# Patient Record
Sex: Female | Born: 1995 | Race: White | Hispanic: No | Marital: Single | Smoking: Never smoker
Health system: Southern US, Community
[De-identification: ages and names within clinical notes are randomized; demographics above are authoritative.]

## PROBLEM LIST (undated history)

## (undated) DIAGNOSIS — S43013A Anterior subluxation of unspecified humerus, initial encounter: Secondary | ICD-10-CM

## (undated) DIAGNOSIS — F329 Major depressive disorder, single episode, unspecified: Secondary | ICD-10-CM

## (undated) DIAGNOSIS — F32A Depression, unspecified: Secondary | ICD-10-CM

## (undated) DIAGNOSIS — S43006A Unspecified dislocation of unspecified shoulder joint, initial encounter: Secondary | ICD-10-CM

## (undated) DIAGNOSIS — G43909 Migraine, unspecified, not intractable, without status migrainosus: Secondary | ICD-10-CM

## (undated) HISTORY — DX: Migraine, unspecified, not intractable, without status migrainosus: G43.909

## (undated) HISTORY — PX: CLOSED REDUCTION SHOULDER DISLOCATION: SUR242

## (undated) HISTORY — DX: Depression, unspecified: F32.A

## (undated) HISTORY — DX: Major depressive disorder, single episode, unspecified: F32.9

---
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
## 2013-08-12 ENCOUNTER — Encounter: Payer: Self-pay | Admitting: Family Medicine

## 2013-08-12 ENCOUNTER — Ambulatory Visit (INDEPENDENT_AMBULATORY_CARE_PROVIDER_SITE_OTHER): Payer: 59 | Admitting: Family Medicine

## 2013-08-12 VITALS — BP 122/78 | Ht 68.0 in | Wt 225.0 lb

## 2013-08-12 DIAGNOSIS — M549 Dorsalgia, unspecified: Secondary | ICD-10-CM

## 2013-08-12 MED ORDER — ETODOLAC 400 MG PO TABS: 400.0000 mg | ORAL_TABLET | Freq: Two times a day (BID) | ORAL | Status: DC

## 2013-08-12 MED ORDER — CHLORZOXAZONE 500 MG PO TABS: 500.0000 mg | ORAL_TABLET | Freq: Every evening | ORAL | Status: DC | PRN

## 2013-08-12 NOTE — Progress Notes (Signed)
   Subjective:    Patient ID: Lauren Park, female    DOB: 04/29/1996, 18 y.o.   MRN: [id number]  Back Pain This is a new problem. The current episode started 1 to 4 weeks ago. The problem occurs daily. The problem is unchanged. The quality of the pain is described as burning and shooting (sharp). The pain does not radiate. The pain is worse during the day. The symptoms are aggravated by sitting and lying down. Stiffness is present all day. Pertinent negatives include no abdominal pain, chest pain, fever, leg pain, numbness, paresis or paresthesias. She has tried NSAIDs and heat for the symptoms. The treatment provided mild relief.   Needs refill on celexa.    Review of Systems  Constitutional: Negative for fever, diaphoresis, activity change and fatigue.  HENT: Negative for congestion.   Respiratory: Negative for cough.   Cardiovascular: Negative for chest pain.  Gastrointestinal: Negative for abdominal pain.  Musculoskeletal: Positive for back pain.  Neurological: Negative for numbness and paresthesias.       Objective:   Physical Exam  Constitutional: She is oriented to person, place, and time. She appears well-developed and well-nourished.  HENT:  Head: Normocephalic.  Cardiovascular: Normal rate, regular rhythm and normal heart sounds.   No murmur heard. Pulmonary/Chest: Effort normal and breath sounds normal. No respiratory distress.  Musculoskeletal: She exhibits no edema.  Lymphadenopathy:    She has no cervical adenopathy.  Neurological: She is alert and oriented to person, place, and time.  Psychiatric: She has a normal mood and affect. Her behavior is normal. Judgment and thought content normal.          Assessment & Plan:  1 pain-this is scapular pain on the left side. Probably rhomboid pain. Anti-inflammatory a regular basis muscle relaxer at nighttime massage and stretching if not better over the next 2-3 weeks notify us may need further testing or possibly  physical therapy  #2-stress related issues under good control has been on Celexa for years requests refills and we will send in refills patient followup 6 months. If ever suicidal needs to be seen right away. Patient understands this. She denies being depressed or suicidal currently. She states when she tried to stop the medicine before she noticed her relapse.

## 2013-08-22 ENCOUNTER — Telehealth: Payer: Self-pay | Admitting: Family Medicine

## 2013-08-22 NOTE — Telephone Encounter (Signed)
Number left on message was incorrect. [facility] on pt's home and voicemail. Need to find out dose of celexa she is taking because it is not on med list. [facility] to refill but they stated they have never filled celexa. Last office  Note states it was sent in but no dosage.

## 2013-08-22 NOTE — Telephone Encounter (Signed)
[facility] Patient told the wrong pharmacy the first time.

## 2013-08-29 MED ORDER — CITALOPRAM HYDROBROMIDE 40 MG PO TABS: 40.0000 mg | ORAL_TABLET | Freq: Every day | ORAL | Status: DC

## 2013-08-29 NOTE — Telephone Encounter (Signed)
Patient states she is on Celexa 40mg  daily. Rx sent electronically to pharmacy. Patient notified.

## 2013-11-13 ENCOUNTER — Telehealth: Payer: Self-pay | Admitting: Family Medicine

## 2013-11-13 MED ORDER — PREDNISONE 20 MG PO TABS: ORAL_TABLET | ORAL | Status: DC

## 2013-11-13 NOTE — Telephone Encounter (Signed)
Medication sent to pharmacy. Patient's mom was notified.

## 2013-11-13 NOTE — Telephone Encounter (Signed)
Patient has poison oak on her legs, arm, face, chin neck. Would like prednisone called in if possible.    [facility]

## 2013-11-13 NOTE — Telephone Encounter (Signed)
Verify not pregnant.may call in prednisone 20 mg, 3qd for 3d then 2qd for 3d then 1qd for 3d

## 2013-11-28 ENCOUNTER — Encounter: Payer: Self-pay | Admitting: Family Medicine

## 2013-11-28 ENCOUNTER — Ambulatory Visit (INDEPENDENT_AMBULATORY_CARE_PROVIDER_SITE_OTHER): Payer: 59 | Admitting: Family Medicine

## 2013-11-28 VITALS — BP 118/76 | Temp 98.4°F | Ht 68.0 in | Wt 234.0 lb

## 2013-11-28 DIAGNOSIS — L259 Unspecified contact dermatitis, unspecified cause: Secondary | ICD-10-CM

## 2013-11-28 MED ORDER — METHYLPREDNISOLONE ACETATE 40 MG/ML IJ SUSP
40.0000 mg | Freq: Once | INTRAMUSCULAR | Status: AC
  Administered 2013-11-28: 40 mg via INTRAMUSCULAR

## 2013-11-28 MED ORDER — PREDNISONE 20 MG PO TABS: ORAL_TABLET | ORAL | Status: DC

## 2013-11-28 NOTE — Progress Notes (Signed)
   Subjective:    Patient ID: Lauren Park, female    DOB: 1996/03/01, 18 y.o.   MRN: [id number]  Rash The current episode started 1 to 4 weeks ago. Pain location: all over body. The rash is characterized by itchiness and redness. Associated with: poison ivy. Treatments tried: calamine lotion, benadryl.  2 weeks ago began No sign of any type secondary infection no fevers. Was treated with prednisone but then it returned on her.  Works outside some. Review of Systems  Skin: Positive for rash.   Complains of itching.    Objective:   Physical Exam Contact dermatitis on both legs also in the arms not weeping. It multiple inflammatory areas.       Assessment & Plan:  Contact dermatitis-prednisone taper for 9 days, Depo-Medrol shot. I believe the patient had a flareup as the previous prednisone tapered off reach repeat. Followup if ongoing troubles.

## 2014-02-06 ENCOUNTER — Encounter: Payer: Self-pay | Admitting: Nurse Practitioner

## 2014-02-06 ENCOUNTER — Ambulatory Visit (INDEPENDENT_AMBULATORY_CARE_PROVIDER_SITE_OTHER): Payer: 59 | Admitting: Nurse Practitioner

## 2014-02-06 VITALS — BP 112/80 | Ht 68.0 in | Wt 239.0 lb

## 2014-02-06 DIAGNOSIS — L708 Other acne: Secondary | ICD-10-CM

## 2014-02-06 DIAGNOSIS — N925 Other specified irregular menstruation: Secondary | ICD-10-CM

## 2014-02-06 DIAGNOSIS — N938 Other specified abnormal uterine and vaginal bleeding: Secondary | ICD-10-CM

## 2014-02-06 DIAGNOSIS — N949 Unspecified condition associated with female genital organs and menstrual cycle: Secondary | ICD-10-CM

## 2014-02-06 DIAGNOSIS — L700 Acne vulgaris: Secondary | ICD-10-CM

## 2014-02-06 MED ORDER — ADAPALENE 0.1 % EX CREA: TOPICAL_CREAM | CUTANEOUS | Status: DC

## 2014-02-06 MED ORDER — NORGESTIMATE-ETH ESTRADIOL 0.25-35 MG-MCG PO TABS: 1.0000 | ORAL_TABLET | Freq: Every day | ORAL | Status: DC

## 2014-02-06 NOTE — Patient Instructions (Signed)
cetaphil cleanser

## 2014-02-10 ENCOUNTER — Encounter: Payer: Self-pay | Admitting: Nurse Practitioner

## 2014-02-10 DIAGNOSIS — L700 Acne vulgaris: Secondary | ICD-10-CM | POA: Insufficient documentation

## 2014-02-10 DIAGNOSIS — N938 Other specified abnormal uterine and vaginal bleeding: Secondary | ICD-10-CM | POA: Insufficient documentation

## 2014-02-10 NOTE — Progress Notes (Signed)
Subjective:  Presents to discuss her acne. Having irregular cycles over the past 2 years. Lasting 7-8 days usually. Slightly heavy flow, changing her super maxi pad to 3 times per day. Has had one partner in the past, no current partner. Has not tried any prescription medications for her acne.  Objective:   BP 112/80  Ht 5\' 8"  (1.727 m)  Wt 239 lb (108.41 kg)  BMI 36.35 kg/m2  LMP 02/01/2014 NAD. Alert, oriented. Lungs clear. Heart regular rate rhythm. Papular/pustular acne noted on the face upper back and slightly on the upper mid chest area.  Assessment: DUB (dysfunctional uterine bleeding)  Acne vulgaris  Plan:  Meds ordered this encounter  Medications  . norgestimate-ethinyl estradiol (ORTHO-CYCLEN,SPRINTEC,PREVIFEM) 0.25-35 MG-MCG tablet    Sig: Take 1 tablet by mouth daily.    Dispense:  3 Package    Refill:  0    Order Specific Question:  Supervising Provider    Answer:  Merlyn AlbertLUKING, WILLIAM S [[id number]]  . adapalene (DIFFERIN) 0.1 % cream    Sig: Apply to affected area nightly    Dispense:  45 g    Refill:  1    Order Specific Question:  Supervising Provider    Answer:  Merlyn AlbertLUKING, WILLIAM S [[id number]]   Return in about 2 months (around 04/08/2014). Call back sooner if any problems. Start birth control pills first Sunday after her next cycle begins. Discussed potential adverse effects associated with OC use.

## 2014-02-27 ENCOUNTER — Ambulatory Visit: Payer: 59 | Admitting: Nurse Practitioner

## 2014-03-23 IMAGING — CR DG SHOULDER 2+V*L*
2 series · 2 of 2 positions shown · non-contrast
Comparison: 01/03/2012

CLINICAL DATA: The patient.  Pain.

LEFT SHOULDER - 2+ VIEW

[view not recorded (1 of 2)]
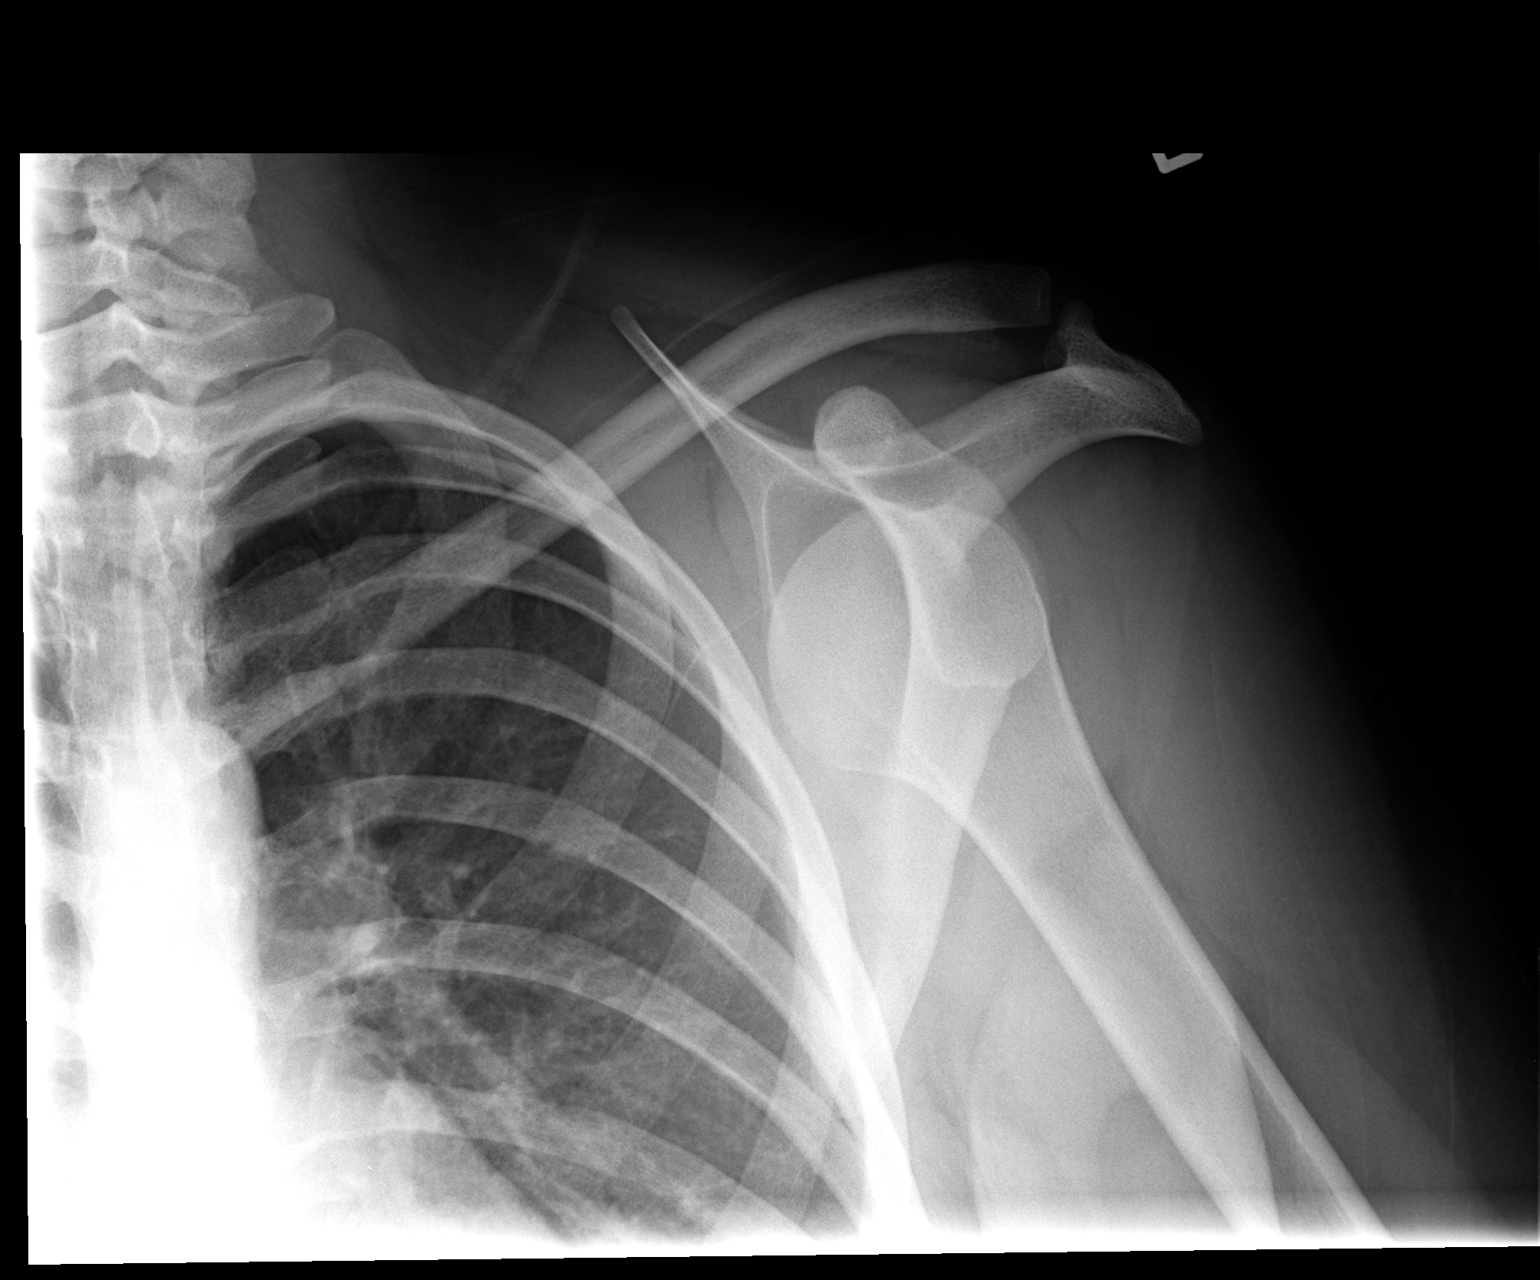

[view not recorded (2 of 2)]
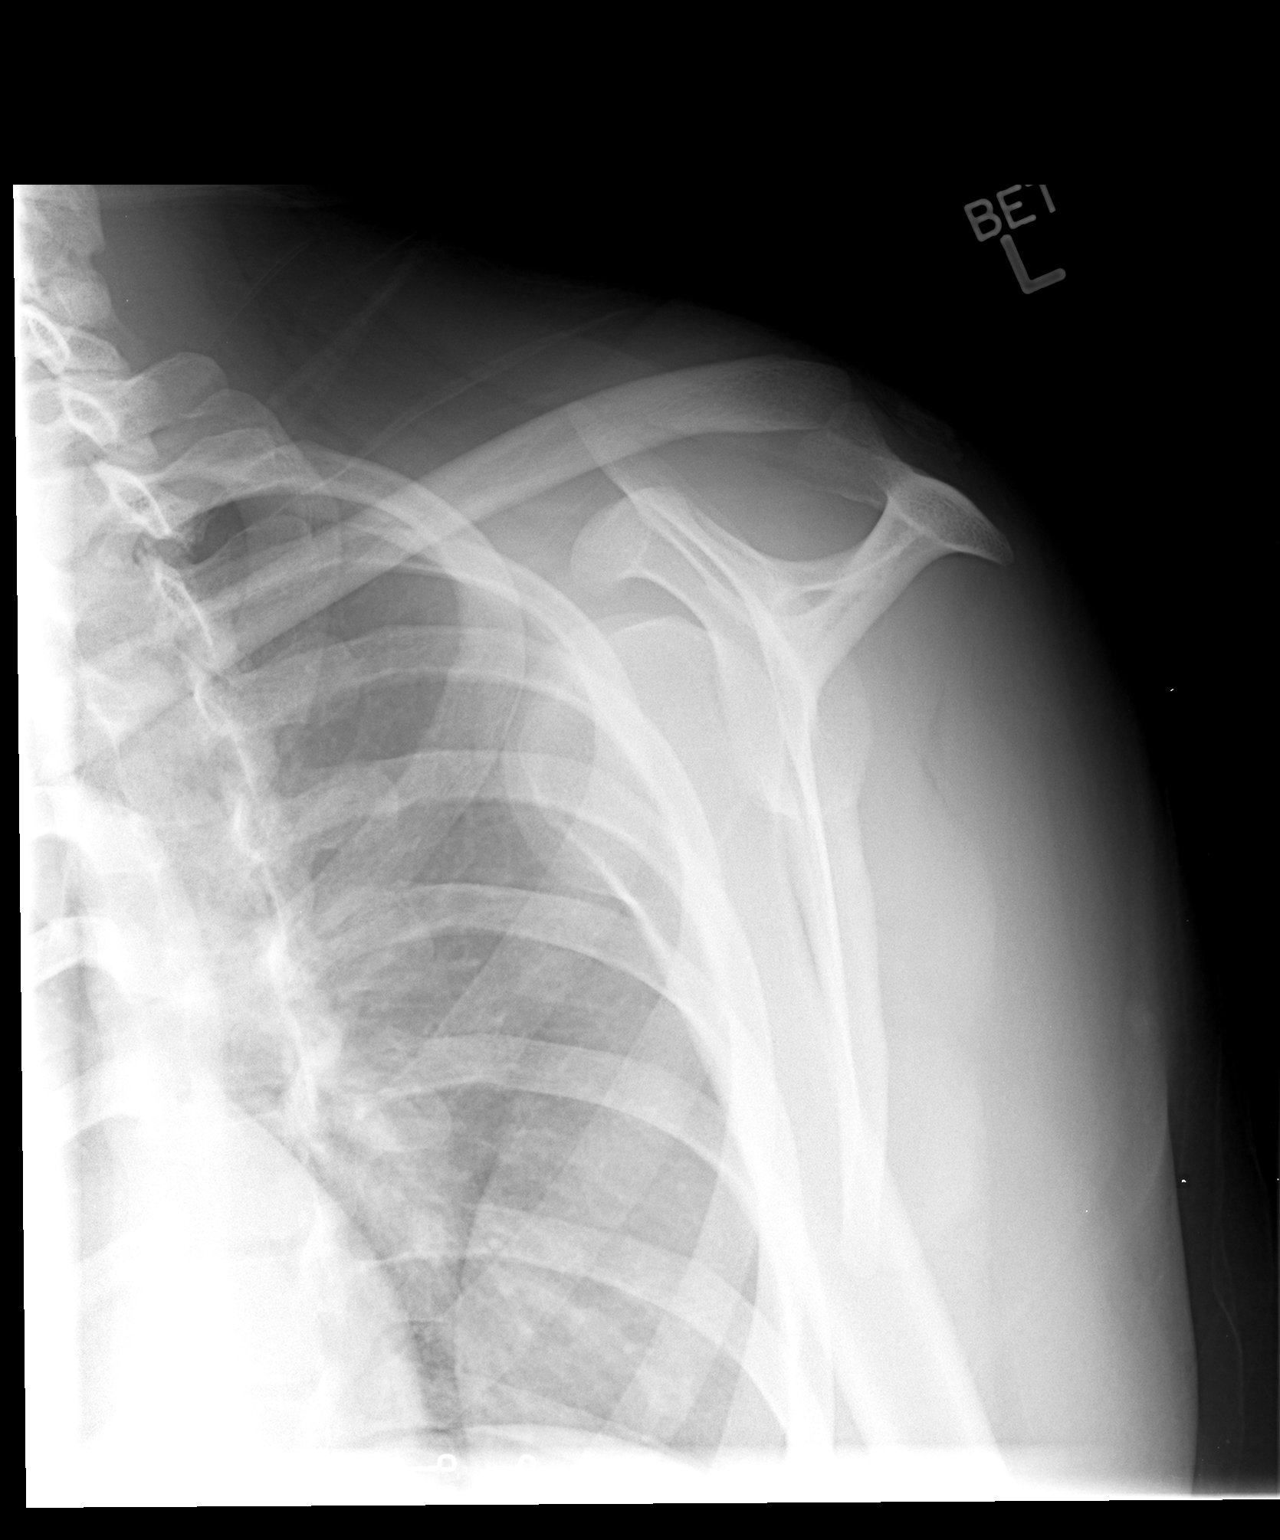

[2 of 2 positions shown; findings below may reference images not displayed]

FINDINGS: There is anterior dislocation of the humeral head
relative to the glenoid.  No discernible fracture.
IMPRESSION: Anterior dislocation of the humeral head relative to the glenoid.

## 2014-03-23 IMAGING — CR DG SHOULDER 2+V*L*
2 series · 2 of 2 positions shown · non-contrast
Comparison: Same day

CLINICAL DATA: Shoulder dislocation and post reduction

LEFT SHOULDER - 2+ VIEW

[view not recorded (1 of 2)]
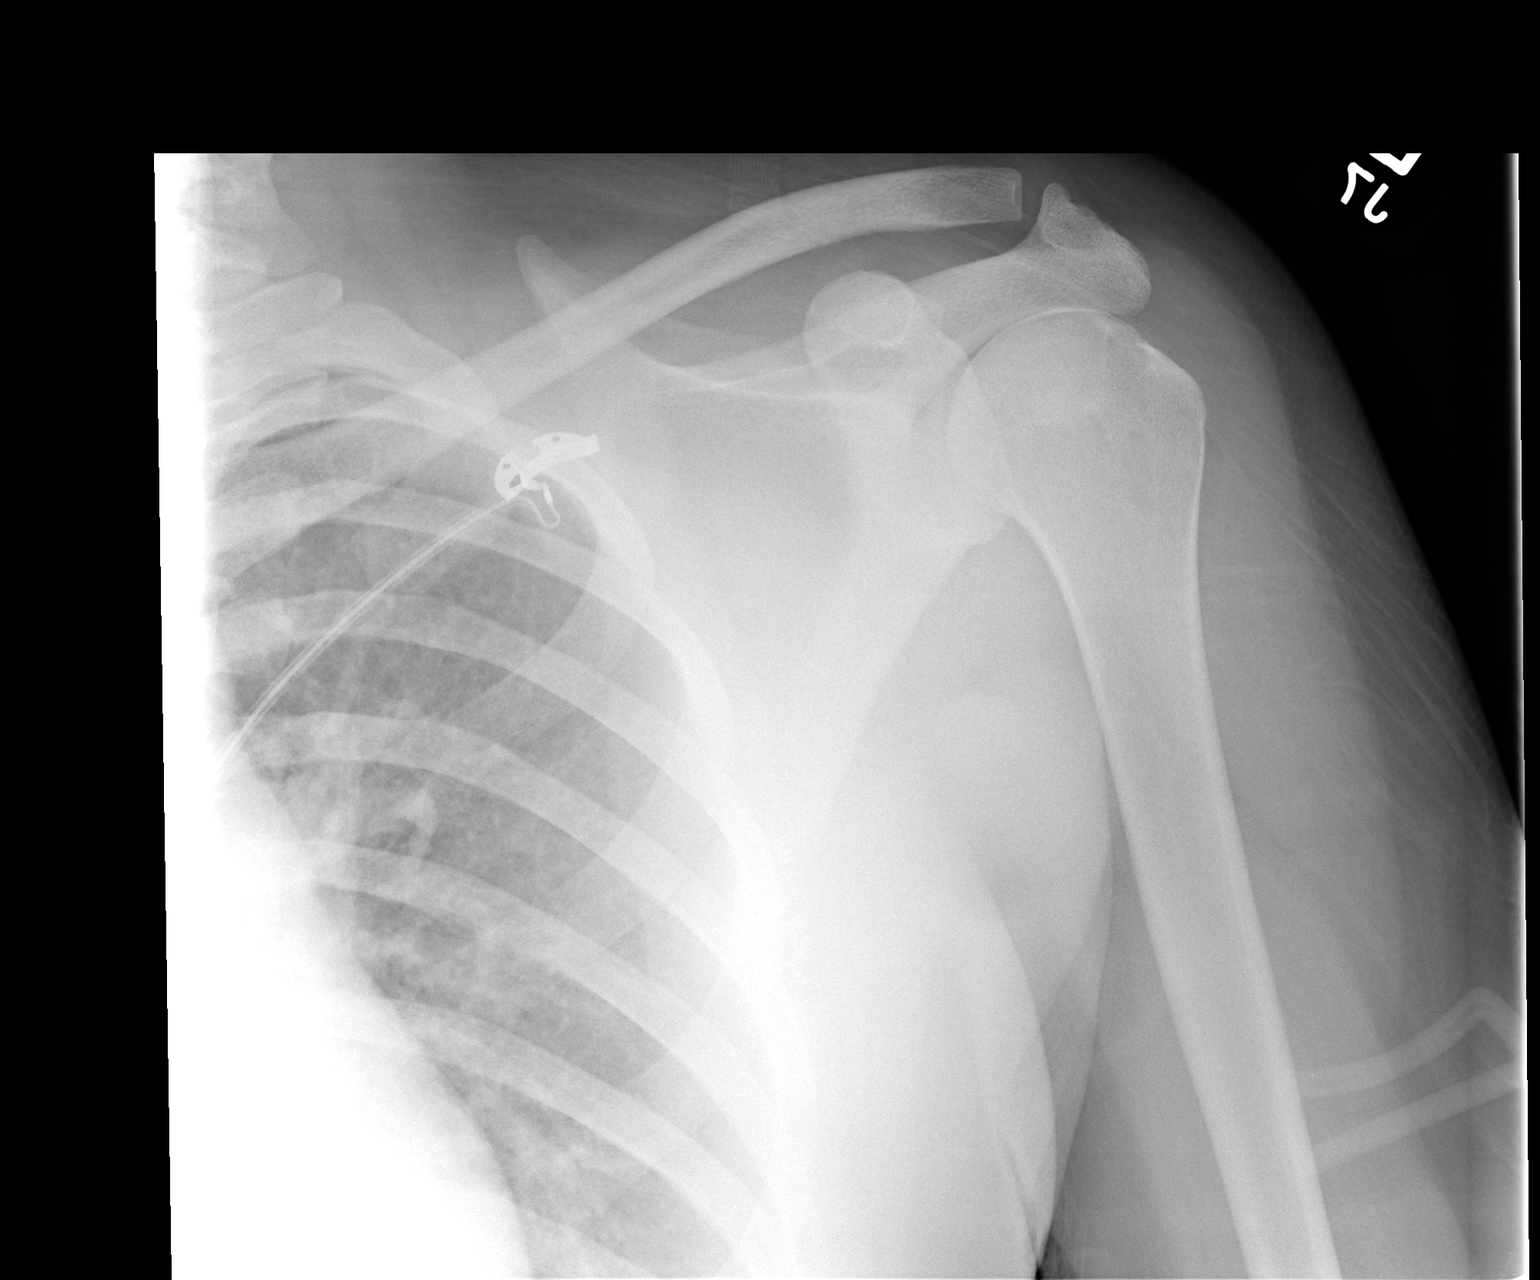

[view not recorded (2 of 2)]
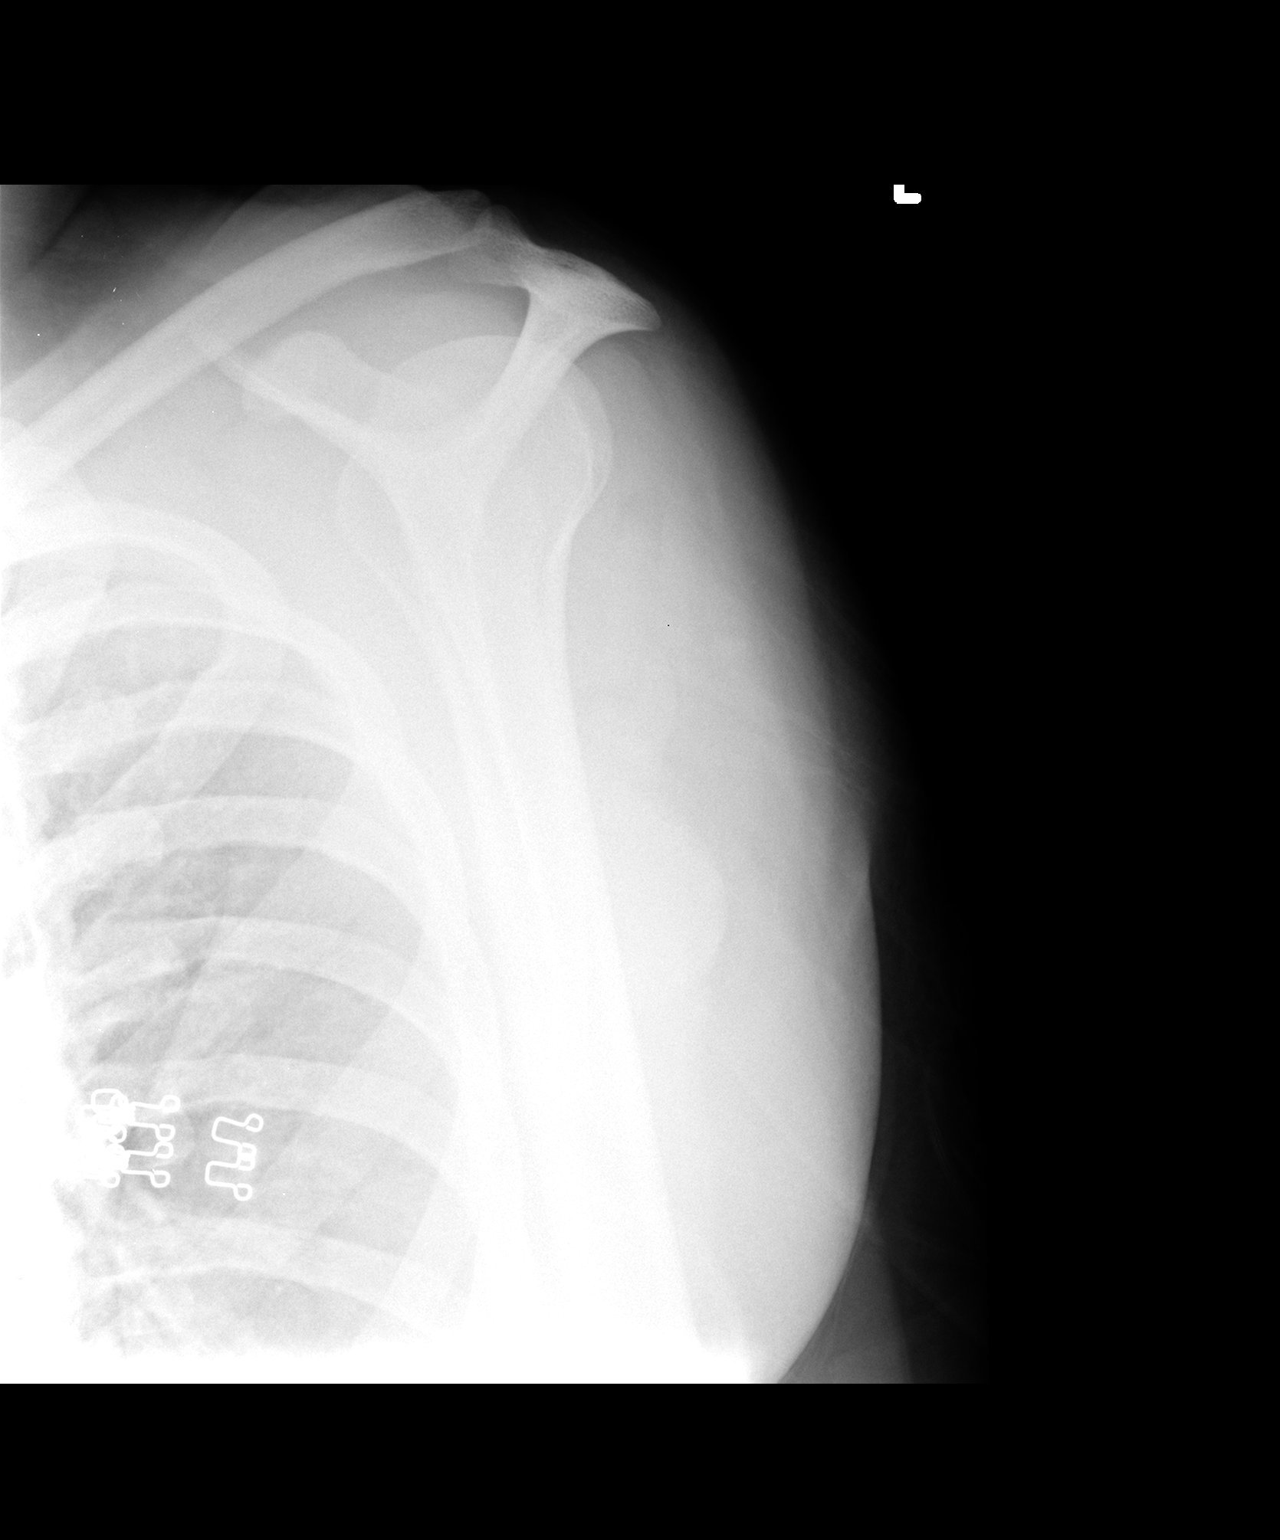

[2 of 2 positions shown; findings below may reference images not displayed]

FINDINGS: The humeral head is relocated in the bony glenoid.  No
definite Hill-Sachs or Bankart fracture.
IMPRESSION: Relocated.  No visible fracture.

## 2014-04-06 ENCOUNTER — Ambulatory Visit: Payer: 59 | Admitting: Nurse Practitioner

## 2014-07-22 ENCOUNTER — Encounter: Payer: Self-pay | Admitting: Nurse Practitioner

## 2014-07-22 ENCOUNTER — Ambulatory Visit (INDEPENDENT_AMBULATORY_CARE_PROVIDER_SITE_OTHER): Payer: 59 | Admitting: Nurse Practitioner

## 2014-07-22 ENCOUNTER — Encounter: Payer: Self-pay | Admitting: Family Medicine

## 2014-07-22 VITALS — BP 102/70 | Temp 98.5°F | Ht 68.0 in | Wt 246.4 lb

## 2014-07-22 DIAGNOSIS — B349 Viral infection, unspecified: Secondary | ICD-10-CM

## 2014-07-25 ENCOUNTER — Encounter: Payer: Self-pay | Admitting: Nurse Practitioner

## 2014-07-25 NOTE — Progress Notes (Signed)
Subjective:  Presents for c/o fever, chills, headache, sore throat, body aches and malaise that began yesterday. No cough. No wheezing. On her menses. No ear pain. No vomiting or diarrhea. Taking fluids well. Voiding nl.   Objective:   BP 102/70 mmHg  Temp(Src) 98.5 F (36.9 C) (Oral)  Ht 5\' 8"  (1.727 m)  Wt 246 lb 6 oz (111.755 kg)  BMI 37.47 kg/m2 NAD. Alert, oriented. TMs clear effusion. Pharynx clear and moist. Neck supple with mild anterior adenopathy. Lungs clear. Heart RRR.   Assessment: Viral illness  Plan: reviewed symptomatic care and warning signs. Call back in 48 hours if no improvement sooner if worse.

## 2015-04-05 ENCOUNTER — Encounter: Payer: Self-pay | Admitting: Nurse Practitioner

## 2015-04-05 ENCOUNTER — Ambulatory Visit (INDEPENDENT_AMBULATORY_CARE_PROVIDER_SITE_OTHER): Payer: 59 | Admitting: Nurse Practitioner

## 2015-04-05 VITALS — BP 110/70 | Ht 68.0 in | Wt 216.2 lb

## 2015-04-05 DIAGNOSIS — N921 Excessive and frequent menstruation with irregular cycle: Secondary | ICD-10-CM

## 2015-04-05 DIAGNOSIS — Z0000 Encounter for general adult medical examination without abnormal findings: Secondary | ICD-10-CM | POA: Diagnosis not present

## 2015-04-07 ENCOUNTER — Encounter: Payer: Self-pay | Admitting: Nurse Practitioner

## 2015-04-07 DIAGNOSIS — N921 Excessive and frequent menstruation with irregular cycle: Secondary | ICD-10-CM | POA: Insufficient documentation

## 2015-04-07 NOTE — Progress Notes (Signed)
   Subjective:    Patient ID: Lauren Park, female    DOB: [birth year], 19 y.o.   MRN: [id number]  HPI presents with her adopted mother for her wellness physical. Currently in college; doing well. Irregular menses; lasting 7 days; heavy 5-6 days. Healthy diet. Regular activity. Regular dental care. Denies history of sexual activity. Has done very well with weight loss efforts.    Review of Systems  Constitutional: Negative for fever, activity change, appetite change and fatigue.  HENT: Negative for dental problem, ear pain, sinus pressure and sore throat.   Eyes: Negative for visual disturbance.  Respiratory: Negative for cough, chest tightness, shortness of breath and wheezing.   Cardiovascular: Negative for chest pain.  Gastrointestinal: Negative for nausea, vomiting, abdominal pain, diarrhea, constipation and abdominal distention.  Genitourinary: Positive for menstrual problem. Negative for dysuria, urgency, frequency, vaginal discharge, enuresis, difficulty urinating, genital sores and pelvic pain.  Psychiatric/Behavioral: Negative for behavioral problems, sleep disturbance and dysphoric mood. The patient is not nervous/anxious.        Objective:   Physical Exam  Constitutional: She is oriented to person, place, and time. She appears well-developed. No distress.  HENT:  Right Ear: External ear normal.  Left Ear: External ear normal.  Mouth/Throat: Oropharynx is clear and moist.  Neck: Normal range of motion. Neck supple. No tracheal deviation present. No thyromegaly present.  Cardiovascular: Normal rate, regular rhythm and normal heart sounds.  Exam reveals no gallop.   No murmur heard. Pulmonary/Chest: Effort normal and breath sounds normal.  Abdominal: Soft. She exhibits no distension. There is no tenderness.  Genitourinary:  Defers GU and breast exams. Denies any problems. Tanner stage V.  Musculoskeletal: She exhibits no edema.  Lymphadenopathy:    She has no cervical  adenopathy.  Neurological: She is alert and oriented to person, place, and time. She has normal reflexes.  Skin: Skin is warm and dry. No rash noted.  Psychiatric: She has a normal mood and affect. Her behavior is normal. Thought content normal.  Vitals reviewed.         Assessment & Plan:   Problem List Items Addressed This Visit      Other   Menorrhagia with irregular cycle   Morbid obesity    Other Visit Diagnoses    Routine general medical examination at a health care facility    -  Primary        Plan:  Discussed options regarding cycles. Defers hormones at this time. Continue weight loss efforts. Discussed safe sex issues. Our office does not have immunization record from early childhood; also this is not in the state system due to year it was done. To get records from previous [place] or school system. Given record of vaccines given at our office.  Return in about 1 year (around 04/04/2016) for physical. PAP smear at next PE.

## 2015-12-21 ENCOUNTER — Ambulatory Visit (INDEPENDENT_AMBULATORY_CARE_PROVIDER_SITE_OTHER): Payer: 59 | Admitting: Nurse Practitioner

## 2015-12-21 ENCOUNTER — Encounter: Payer: Self-pay | Admitting: Nurse Practitioner

## 2015-12-21 VITALS — BP 110/80 | Temp 98.8°F | Ht 68.0 in | Wt 253.4 lb

## 2015-12-21 DIAGNOSIS — L509 Urticaria, unspecified: Secondary | ICD-10-CM

## 2015-12-21 MED ORDER — TRIAMCINOLONE ACETONIDE 0.1 % EX CREA: 1.0000 "application " | TOPICAL_CREAM | Freq: Two times a day (BID) | CUTANEOUS | Status: DC

## 2015-12-21 MED FILL — TRIAMCINOLONE 0.1% CREAM: 0.1 | 14 days supply | Qty: 30 | Fill #0

## 2015-12-21 NOTE — Patient Instructions (Signed)
Loratadine 10 mg every morning Benadryl 25 mg every night Pepcid every day

## 2015-12-23 ENCOUNTER — Encounter: Payer: Self-pay | Admitting: Nurse Practitioner

## 2015-12-23 NOTE — Progress Notes (Signed)
Subjective:  Presents for complaints of off-and-on rash that began back in the fall. Was better during the winter. Has started flaring up again. Has not identified any specific triggers. No known allergens. Different areas of the body. Resolves on its own. Very pruritic. Patient has a picture of an early lesion which is clearly a whelp/hive.  Objective:   BP 110/80 mmHg  Temp(Src) 98.8 F (37.1 C) (Oral)  Ht 5\' 8"  (1.727 m)  Wt 253 lb 6 oz (114.93 kg)  BMI 38.53 kg/m2 NAD. Alert, oriented. Lungs clear. Heart regular rate rhythm. Fading minimally raised pink area on the left arm with a few almost resolved pink nonraised areas on the legs.  Assessment: Hives  Plan:  Meds ordered this encounter  Medications  . triamcinolone cream (KENALOG) 0.1 %    Sig: Apply 1 application topically 2 (two) times daily. Prn rash; use up to 2 weeks    Dispense:  30 g    Refill:  0    Order Specific Question:  Supervising Provider    Answer:  Merlyn AlbertLUKING, WILLIAM S [[id number]]   Loratadine 10 mg every morning Benadryl 25 mg every night Pepcid every day  Take this regimen daily. Continue to watch for any triggers for the itching. Call back in 2-3 weeks if persist, plan referral to allergy specialist at that time. Triamcinolone cream to help with itching. Patient understands that rash is more systemic versus a contact rash.Based on the history, feel that excessive heat may be one trigger.

## 2016-01-27 DIAGNOSIS — L282 Other prurigo: Secondary | ICD-10-CM | POA: Diagnosis not present

## 2016-01-31 MED FILL — CLOBETASOL 0.05% CREAM: 0.05 | 20 days supply | Qty: 60 | Fill #0

## 2016-03-30 ENCOUNTER — Ambulatory Visit (INDEPENDENT_AMBULATORY_CARE_PROVIDER_SITE_OTHER): Payer: 59 | Admitting: Nurse Practitioner

## 2016-03-30 ENCOUNTER — Encounter: Payer: Self-pay | Admitting: Nurse Practitioner

## 2016-03-30 VITALS — BP 122/74 | Ht 68.0 in | Wt 245.0 lb

## 2016-03-30 DIAGNOSIS — R5383 Other fatigue: Secondary | ICD-10-CM

## 2016-03-30 DIAGNOSIS — N938 Other specified abnormal uterine and vaginal bleeding: Secondary | ICD-10-CM | POA: Diagnosis not present

## 2016-03-30 MED ORDER — NORGESTIMATE-ETH ESTRADIOL 0.25-35 MG-MCG PO TABS
1.0000 | ORAL_TABLET | Freq: Every day | ORAL | 3 refills | Status: DC
Start: 2016-03-30 — End: 2017-03-13

## 2016-03-30 MED FILL — MONO-LINYAH 28 TABLET: 0.25-35 | 84 days supply | Qty: 84 | Fill #0

## 2016-03-31 ENCOUNTER — Encounter: Payer: Self-pay | Admitting: Nurse Practitioner

## 2016-03-31 LAB — BASIC METABOLIC PANEL
BUN/Creatinine Ratio: 11 (ref 9–23)
BUN: 8 mg/dL (ref 6–20)
CALCIUM: 9.7 mg/dL (ref 8.7–10.2)
CO2: 23 mmol/L (ref 18–29)
CREATININE: 0.7 mg/dL (ref 0.57–1.00)
Chloride: 103 mmol/L (ref 96–106)
GFR calc Af Amer: 143 mL/min/{1.73_m2} (ref >59)
GFR calc non Af Amer: 124 mL/min/{1.73_m2} (ref >59)
GLUCOSE: 90 mg/dL (ref 65–99)
Potassium: 4.4 mmol/L (ref 3.5–5.2)
Sodium: 142 mmol/L (ref 134–144)

## 2016-03-31 LAB — HEPATIC FUNCTION PANEL
ALBUMIN: 4.5 g/dL (ref 3.5–5.5)
ALK PHOS: 107 IU/L (ref 39–117)
ALT: 11 IU/L (ref 0–32)
AST: 17 IU/L (ref 0–40)
BILIRUBIN, DIRECT: 0.09 mg/dL (ref 0.00–0.40)
Bilirubin Total: 0.4 mg/dL (ref 0.0–1.2)
TOTAL PROTEIN: 7.7 g/dL (ref 6.0–8.5)

## 2016-03-31 LAB — CBC WITH DIFFERENTIAL/PLATELET
Basophils Absolute: 0 10*3/uL (ref 0.0–0.2)
Basos: 0 %
EOS (ABSOLUTE): 0.1 10*3/uL (ref 0.0–0.4)
EOS: 1 %
HEMATOCRIT: 38.8 % (ref 34.0–46.6)
HEMOGLOBIN: 12.8 g/dL (ref 11.1–15.9)
IMMATURE GRANULOCYTES: 0 %
Immature Grans (Abs): 0 10*3/uL (ref 0.0–0.1)
LYMPHS ABS: 3.6 10*3/uL — AB (ref 0.7–3.1)
Lymphs: 41 %
MCH: 24.7 pg — ABNORMAL LOW (ref 26.6–33.0)
MCHC: 33 g/dL (ref 31.5–35.7)
MCV: 75 fL — ABNORMAL LOW (ref 79–97)
MONOCYTES: 7 %
Monocytes Absolute: 0.6 10*3/uL (ref 0.1–0.9)
Neutrophils Absolute: 4.5 10*3/uL (ref 1.4–7.0)
Neutrophils: 51 %
Platelets: 354 10*3/uL (ref 150–379)
RBC: 5.19 x10E6/uL (ref 3.77–5.28)
RDW: 15.8 % — ABNORMAL HIGH (ref 12.3–15.4)
WBC: 8.9 10*3/uL (ref 3.4–10.8)

## 2016-03-31 LAB — LIPID PANEL
CHOL/HDL RATIO: 4.1 ratio (ref 0.0–4.4)
Cholesterol, Total: 160 mg/dL (ref 100–199)
HDL: 39 mg/dL — ABNORMAL LOW (ref >39)
LDL CALC: 93 mg/dL (ref 0–99)
Triglycerides: 138 mg/dL (ref 0–149)
VLDL Cholesterol Cal: 28 mg/dL (ref 5–40)

## 2016-03-31 LAB — TSH: TSH: 2.93 u[IU]/mL (ref 0.450–4.500)

## 2016-03-31 LAB — VITAMIN D 25 HYDROXY (VIT D DEFICIENCY, FRACTURES): Vit D, 25-Hydroxy: 24.5 ng/mL — ABNORMAL LOW (ref 30.0–100.0)

## 2016-03-31 NOTE — Progress Notes (Signed)
Subjective:  Presents for c/o fatigue, headaches, sleep disturbance and hot flashes. Not currently on birth control. Not sexually active. Irregular cycles; can have 2 in one month or skip 2-3 months. Consistently 7-8 days but varying flow. Has had problems off and on with this for years. Has been treated with Depo-Medrol and Ortho-Cyclen in the past. Has a remote history of migraine, has had 2 over the past 2 weeks. Mainly relates them to significant weather change. No change in migraine symptomatology. Does not seem to have a connection to her cycles. Has had significant weight gain over the past year. At one time was doing well with this and working out but has been struggling. Some acne. No excessive hair growth.  Objective:   BP 122/74   Ht 5\' 8"  (1.727 m)   Wt 245 lb (111.1 kg)   BMI 37.25 kg/m  NAD. Alert, oriented. Lungs clear. Heart regular rate rhythm. Thyroid nontender to palpation, no mass or goiter noted. Has had some recent weight loss over the past few months, still has gained 30 pounds since last year. Mild facial acne.  Assessment:  Problem List Items Addressed This Visit      Other   DUB (dysfunctional uterine bleeding) - Primary   Relevant Orders   CBC with Differential/Platelet (Completed)   TSH (Completed)   Basic metabolic panel (Completed)   Hepatic function panel (Completed)   Lipid panel (Completed)   VITAMIN D 25 Hydroxy (Vit-D Deficiency, Fractures) (Completed)   Morbid obesity (HCC)   Relevant Orders   CBC with Differential/Platelet (Completed)   TSH (Completed)   Basic metabolic panel (Completed)   Hepatic function panel (Completed)   Lipid panel (Completed)   VITAMIN D 25 Hydroxy (Vit-D Deficiency, Fractures) (Completed)    Other Visit Diagnoses    Other fatigue       Relevant Orders   CBC with Differential/Platelet (Completed)   TSH (Completed)   Basic metabolic panel (Completed)   Hepatic function panel (Completed)   Lipid panel (Completed)   VITAMIN D 25 Hydroxy (Vit-D Deficiency, Fractures) (Completed)     Plan:  Meds ordered this encounter  Medications  . clobetasol cream (TEMOVATE) 0.05 %    Sig: Apply 1 application topically 2 (two) times daily.  . norgestimate-ethinyl estradiol (ORTHO-CYCLEN,SPRINTEC,PREVIFEM) 0.25-35 MG-MCG tablet    Sig: Take 1 tablet by mouth daily.    Dispense:  3 Package    Refill:  3    Order Specific Question:   Supervising Provider    Answer:   Merlyn AlbertLUKING, WILLIAM S [[id number]]   Restart Ortho-Cyclen as directed. Lab work pending. Encouraged healthy diet low in sugar and simple carbs and regular activity. Patient wishes to hold on medication for migraines at this point, will wait to see if these continue. Patient to call back at that time to discuss options. Warning signs reviewed. Reminded about preventive health physical. Return if symptoms worsen or fail to improve.

## 2016-06-05 MED FILL — MONO-LINYAH 28 TABLET: 0.25-35 | 84 days supply | Qty: 84 | Fill #1

## 2016-09-21 MED FILL — MONO-LINYAH 28 TABLET: 0.25-35 | 84 days supply | Qty: 84 | Fill #2

## 2016-11-20 ENCOUNTER — Ambulatory Visit (INDEPENDENT_AMBULATORY_CARE_PROVIDER_SITE_OTHER): Payer: 59 | Admitting: Nurse Practitioner

## 2016-11-20 ENCOUNTER — Encounter: Payer: Self-pay | Admitting: Nurse Practitioner

## 2016-11-20 VITALS — BP 118/76 | Ht 67.0 in | Wt 253.5 lb

## 2016-11-20 DIAGNOSIS — Z0000 Encounter for general adult medical examination without abnormal findings: Secondary | ICD-10-CM

## 2016-11-20 DIAGNOSIS — G479 Sleep disorder, unspecified: Secondary | ICD-10-CM | POA: Insufficient documentation

## 2016-11-20 MED ORDER — FLUCONAZOLE 150 MG PO TABS: ORAL_TABLET | ORAL | 1 refills | Status: DC

## 2016-11-20 MED FILL — FLUCONAZOLE 150 MG TABLET: 150 | 6 days supply | Qty: 6 | Fill #0

## 2016-11-20 NOTE — Progress Notes (Signed)
Subjective:    Patient ID: Lauren Park, female    DOB: Jun 19, 1996, 21 y.o.   MRN: [id number]  HPI presents for her wellness exam. Regular cycles, slightly heavy at times. No missed birth control pills. Currently on her cycle, defers pelvic exam today. Has had 1 sexual partner. Not currently sexually active. Complaints of vaginal itching with clumpy discharge after each cycle which resolved after several days. Overall healthy diet. Swims a mile about 3 days a week for the past 6 weeks. Has not had regular dental care. Has a history of difficulty sleeping. No trouble going to sleep waking up every few hours. Denies any overt symptoms of sleep apnea. Depression screen [facility] 2/9 11/20/2016 11/20/2016  Decreased Interest 1 1  Down, Depressed, Hopeless 2 2  PHQ - 2 Score 3 3  Altered sleeping 2 -  Tired, decreased energy 2 -  Change in appetite 2 -  Feeling bad or failure about yourself  1 -  Trouble concentrating 0 -  Moving slowly or fidgety/restless 0 -  Suicidal thoughts 0 -  PHQ-9 Score 10 -       Review of Systems  Constitutional: Negative for activity change, appetite change and fatigue.  HENT: Negative for dental problem, ear pain, sinus pressure and sore throat.   Respiratory: Negative for cough, chest tightness, shortness of breath and wheezing.   Cardiovascular: Negative for chest pain.  Gastrointestinal: Negative for abdominal distention, abdominal pain, constipation, diarrhea, nausea and vomiting.  Genitourinary: Positive for vaginal discharge. Negative for difficulty urinating, dysuria, enuresis, frequency, genital sores, menstrual problem, pelvic pain and urgency.  Skin:       Acne much improved on birth control pill.  Psychiatric/Behavioral: Positive for dysphoric mood and sleep disturbance. Negative for suicidal ideas. The patient is not nervous/anxious.        Mild depression symptoms. See PHQ9       Objective:   Physical Exam  Constitutional: She is oriented to  person, place, and time. She appears well-developed. No distress.  HENT:  Right Ear: External ear normal.  Left Ear: External ear normal.  Mouth/Throat: Oropharynx is clear and moist.  Neck: Normal range of motion. Neck supple. No tracheal deviation present. No thyromegaly present.  Cardiovascular: Normal rate, regular rhythm and normal heart sounds.  Exam reveals no gallop.   No murmur heard. Pulmonary/Chest: Effort normal and breath sounds normal.  Abdominal: Soft. She exhibits no distension. There is no tenderness.  Genitourinary:  Genitourinary Comments: Defers pelvic exam due to menses.   Musculoskeletal: Normal range of motion.  Lymphadenopathy:    She has no cervical adenopathy.  Neurological: She is alert and oriented to person, place, and time.  Skin: Skin is warm and dry. No rash noted.  Psychiatric: She has a normal mood and affect. Her behavior is normal.  Vitals reviewed. Breast exam: areas of dense tissue; no masses; axillae no adenopathy.        Assessment & Plan:   Problem List Items Addressed This Visit      Other   Sleep disturbance    Other Visit Diagnoses    Routine general medical examination at a health care facility    -  Primary     Meds ordered this encounter  Medications  . fluconazole (DIFLUCAN) 150 MG tablet    Sig: One po qd prn yeast infection; may repeat in 3-4 days if needed    Dispense:  6 tablet    Refill:  1  Order Specific Question:   Supervising Provider    Answer:   Merlyn AlbertLUKING, WILLIAM S [[id number]]   Trial of Diflucan for possible recurrent vaginal yeast infection following her cycle. Given [place] questionnaire for screening for OSA as a precaution. Trial of Tylenol PM or Melatonin ER for sleep. Consider Trazodone if no improvement. Defers medication for mild depression symptoms at this time. Recommend dental care. Recommend PAP smear within the next several months to include GC/CT.  Return in about 1 year (around 11/20/2017) for  physical.

## 2016-12-06 MED FILL — MONO-LINYAH 28 TABLET: 0.25-35 | 84 days supply | Qty: 84 | Fill #3

## 2017-01-05 ENCOUNTER — Ambulatory Visit (INDEPENDENT_AMBULATORY_CARE_PROVIDER_SITE_OTHER): Payer: 59 | Admitting: Nurse Practitioner

## 2017-01-05 ENCOUNTER — Encounter: Payer: Self-pay | Admitting: Nurse Practitioner

## 2017-01-05 VITALS — BP 124/78 | Ht 67.0 in | Wt 253.0 lb

## 2017-01-05 DIAGNOSIS — Z113 Encounter for screening for infections with a predominantly sexual mode of transmission: Secondary | ICD-10-CM | POA: Diagnosis not present

## 2017-01-05 DIAGNOSIS — Z01419 Encounter for gynecological examination (general) (routine) without abnormal findings: Secondary | ICD-10-CM | POA: Diagnosis not present

## 2017-01-05 DIAGNOSIS — Z124 Encounter for screening for malignant neoplasm of cervix: Secondary | ICD-10-CM | POA: Diagnosis not present

## 2017-01-05 DIAGNOSIS — R5383 Other fatigue: Secondary | ICD-10-CM | POA: Diagnosis not present

## 2017-01-05 LAB — POCT HEMOGLOBIN: Hemoglobin: 12.8 g/dL (ref 12.2–16.2)

## 2017-01-05 NOTE — Progress Notes (Signed)
Subjective:  Presents for her PAP smear. Was unable to obtain at her physical due to menses. Doing well on oc. No current sexual partner. Vaginal discharge resolved with Diflucan. Has PE form for college.  Objective:   BP 124/78   Ht 5\' 7"  (1.702 m)   Wt 253 lb 0.6 oz (114.8 kg)   LMP 12/11/2016 (Approximate)   BMI 39.63 kg/m  NAD. Alert, oriented. External GU: no rashes or lesions. Vagina: minimal amount white to clear mucoid discharge. Cervix normal in appearance. No CMT. Bimanual exam: no tenderness or obvious masses. Exam limited due to abd girth.  Results for orders placed or performed in visit on 01/05/17  POCT hemoglobin  Result Value Ref Range   Hemoglobin 12.8 12.2 - 16.2 g/dL   Hgb required on PE form.   Assessment:  Encounter for cervical Pap smear with pelvic exam - Plan: Pap IG and Chlamydia/Gonococcus, NAA  Screening for cervical cancer - Plan: Pap IG and Chlamydia/Gonococcus, NAA  Routine screening for STI (sexually transmitted infection) - Plan: Pap IG and Chlamydia/Gonococcus, NAA  Other fatigue - Plan: POCT hemoglobin    Plan:  Continue pill as directed. PE in one year, call back sooner if needed.

## 2017-01-08 LAB — PAP IG AND CT-NG NAA
Chlamydia, Nuc. Acid Amp: NEGATIVE
GONOCOCCUS BY NUCLEIC ACID AMP: NEGATIVE
PAP Smear Comment: 0

## 2017-03-13 ENCOUNTER — Other Ambulatory Visit: Payer: Self-pay | Admitting: Nurse Practitioner

## 2017-03-13 MED FILL — NORG-ETHIN ESTRA 0.25-0.035: 0.25-35 | 84 days supply | Qty: 84 | Fill #0

## 2017-05-15 ENCOUNTER — Ambulatory Visit (INDEPENDENT_AMBULATORY_CARE_PROVIDER_SITE_OTHER): Payer: 59 | Admitting: Family Medicine

## 2017-05-15 ENCOUNTER — Encounter: Payer: Self-pay | Admitting: Family Medicine

## 2017-05-15 VITALS — BP 124/76 | Ht 68.0 in | Wt 264.0 lb

## 2017-05-15 DIAGNOSIS — L709 Acne, unspecified: Secondary | ICD-10-CM | POA: Diagnosis not present

## 2017-05-15 MED ORDER — DOXYCYCLINE HYCLATE 100 MG PO TABS: 100.0000 mg | ORAL_TABLET | Freq: Every day | ORAL | 1 refills | Status: DC

## 2017-05-15 MED FILL — DOXYCYCLINE HYCLATE 100 MG: 100 | 90 days supply | Qty: 90 | Fill #0

## 2017-05-15 NOTE — Progress Notes (Signed)
Scott, generally the 2 most common progesterones used specifically for acne are norgestimate (which she is on) and drospirenone (Yaz). While Dianah FieldYaz is used, there is an increased risk of DVT as compared to the other pills. I doubt the pill is adding to her acne; does she have signs of PCOS? If so, aldactone may help if she has evidence of elevated testosterone. Let me know how I can help.  Eber Jonesarolyn

## 2017-05-15 NOTE — Progress Notes (Signed)
   Subjective:    Patient ID: Lauren Park, female    DOB: 03/03/1996, 21 y.o.   MRN: [id number]  HPI Patient comes in today for refills on her birthcontrol.She states she is having some problems with her acne on this new birth control. 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     Assessment & Plan:  Dysfunctional uterine bleeding doing well on current birth control but patient wonders if there is birth control that could have potentially less acne involvement  Mild acne patient does not want to use topical she would like to try oral medicine I recommend doxycycline 1 daily follow-up within 3 months   I will discuss the case with Our nurse practitioner it is possible that a different birth control pill will be necessary

## 2017-05-29 ENCOUNTER — Telehealth: Payer: Self-pay | Admitting: Family Medicine

## 2017-05-29 NOTE — Telephone Encounter (Signed)
Patient seen Dr. Lorin PicketScott on 05/15/17.  She said he was going to check with Lauren Park regarding her birth control being changed, but she hasn't heard anything back.  Please advise.   [facility]

## 2017-05-30 NOTE — Telephone Encounter (Signed)
I discussed case with Eber Jonesarolyn.  She states the current medication she is on/birth control pill does help acne.  The other birth control pill is Yaz-this does carry with it a slight increase risk of DVTs/blood clots.  If the patient would like to change to Yaz please let me know and we can send in prescription if she would like to stick with what she is doing that would be fine.  We can also do a dermatology consult if the patient is interested for her acne.  Finally Eber Jonesarolyn did recommend a testosterone level because if that is elevated adding Aldactone could help her acne.  Please discuss with patient thank you

## 2017-06-01 MED ORDER — NORGESTIMATE-ETH ESTRADIOL 0.25-35 MG-MCG PO TABS: 1.0000 | ORAL_TABLET | Freq: Every day | ORAL | 3 refills | Status: DC

## 2017-06-01 MED FILL — NORG-ETHIN ESTRA 0.25-0.035: 0.25-35 | 84 days supply | Qty: 84 | Fill #0

## 2017-06-01 NOTE — Telephone Encounter (Signed)
Patient stated she was put on Doxycycline a few weeks ago for the acne and it seems to be helping so she is going to stick with that and her current birth control for now and hold on other options for now and call back if she wants to try something different.

## 2017-06-14 ENCOUNTER — Encounter: Payer: Self-pay | Admitting: Family Medicine

## 2017-06-14 ENCOUNTER — Ambulatory Visit: Payer: 59 | Admitting: Family Medicine

## 2017-06-14 ENCOUNTER — Ambulatory Visit (INDEPENDENT_AMBULATORY_CARE_PROVIDER_SITE_OTHER): Payer: 59 | Admitting: Family Medicine

## 2017-06-14 VITALS — Temp 98.9°F | Ht 68.0 in | Wt 265.0 lb

## 2017-06-14 DIAGNOSIS — E162 Hypoglycemia, unspecified: Secondary | ICD-10-CM

## 2017-06-14 DIAGNOSIS — G43009 Migraine without aura, not intractable, without status migrainosus: Secondary | ICD-10-CM | POA: Diagnosis not present

## 2017-06-14 DIAGNOSIS — G44229 Chronic tension-type headache, not intractable: Secondary | ICD-10-CM | POA: Diagnosis not present

## 2017-06-14 LAB — POCT GLYCOSYLATED HEMOGLOBIN (HGB A1C): Hemoglobin A1C: 5.2

## 2017-06-14 LAB — POCT GLUCOSE (DEVICE FOR HOME USE): POC GLUCOSE: 87 mg/dL (ref 70–99)

## 2017-06-14 MED ORDER — SUMATRIPTAN SUCCINATE 50 MG PO TABS: ORAL_TABLET | ORAL | 2 refills | Status: DC

## 2017-06-14 MED ORDER — ONDANSETRON HCL 8 MG PO TABS: ORAL_TABLET | ORAL | 2 refills | Status: DC

## 2017-06-14 MED ORDER — TIZANIDINE HCL 2 MG PO CAPS: ORAL_CAPSULE | ORAL | 2 refills | Status: DC

## 2017-06-14 MED FILL — tiZANidine HCL 2 MG TABS: 2 | 7 days supply | Qty: 21 | Fill #0

## 2017-06-14 MED FILL — ONDANSETRON HCL 8 MG TABLET: 8 | 7 days supply | Qty: 21 | Fill #0

## 2017-06-14 MED FILL — SUMATRIPTAN SUCC 50 MG TABL: 50 | 30 days supply | Qty: 12 | Fill #0

## 2017-06-14 NOTE — Progress Notes (Signed)
   Subjective:    Patient ID: Lauren Park, female    DOB: 08/27/1995, 21 y.o.   MRN: [id number]  HPI  Patient is here today with some nausea and a headache since Monday and her blood sugars having been running low. Checked this am and bs was at 40. Pt states she has ate today around two hours ago bs is 87 two hours after eating. Results for orders placed or performed in visit on 06/14/17  POCT glycosylated hemoglobin (Hb A1C)  Result Value Ref Range   Hemoglobin A1C 5.2   POCT Glucose (Device for Home Use)  Result Value Ref Range   Glucose Fasting, POC  70 - 99 mg/dL   POC Glucose 87 70 - 99 mg/dl  Had a headache last night through the night and into this morning did not eat breakfast walked a long distance to class had a low blood sugar when she got home felt bad and nauseous.  Review of Systems Relates throbbing headache that comes and goes does not wake her up at night no double vision no sweats or chills    Objective:   Physical Exam With no unilateral numbness or weakness lungs are clear heart regular HEENT is benign   There is no sign of any infection going on no sign of brain tumor if she starts having headaches in the middle the night or vomiting with her headaches or they get worse she is to follow-up may need to be on a long-acting headache preventer depending on what her headache diary shows once again follow-up if ongoing headaches  If any problems with blood sugars in the future to follow-up as well dietary strategies recommended to avoid low sugars    Assessment & Plan:  Hypoglycemia related to not eating enough we talked about proper diet  Frequent chronic mild headaches minimize use of Tylenol ibuprofen  Occasional migraines keep track of headache record send us readings in several weeks may use tizanidine when all else fails at home use only may use Imitrex when having severe migraine may use Zofran when necessary for nausea

## 2021-08-03 ENCOUNTER — Encounter: Payer: Self-pay | Admitting: Nurse Practitioner

## 2021-08-03 ENCOUNTER — Other Ambulatory Visit: Payer: Self-pay

## 2021-08-03 ENCOUNTER — Ambulatory Visit (INDEPENDENT_AMBULATORY_CARE_PROVIDER_SITE_OTHER): Payer: 59 | Admitting: Nurse Practitioner

## 2021-08-03 VITALS — BP 112/79 | HR 94 | Temp 98.2°F | Ht 68.0 in | Wt 273.0 lb

## 2021-08-03 DIAGNOSIS — F515 Nightmare disorder: Secondary | ICD-10-CM | POA: Diagnosis not present

## 2021-08-03 DIAGNOSIS — N921 Excessive and frequent menstruation with irregular cycle: Secondary | ICD-10-CM | POA: Diagnosis not present

## 2021-08-03 DIAGNOSIS — N6312 Unspecified lump in the right breast, upper inner quadrant: Secondary | ICD-10-CM | POA: Diagnosis not present

## 2021-08-03 DIAGNOSIS — Z0000 Encounter for general adult medical examination without abnormal findings: Secondary | ICD-10-CM

## 2021-08-03 DIAGNOSIS — Z124 Encounter for screening for malignant neoplasm of cervix: Secondary | ICD-10-CM | POA: Diagnosis not present

## 2021-08-03 DIAGNOSIS — Z01419 Encounter for gynecological examination (general) (routine) without abnormal findings: Secondary | ICD-10-CM

## 2021-08-03 MED ORDER — NORGESTIMATE-ETH ESTRADIOL 0.25-35 MG-MCG PO TABS: 1.0000 | ORAL_TABLET | Freq: Every day | ORAL | 11 refills | Status: DC

## 2021-08-03 NOTE — Progress Notes (Addendum)
Subjective:    Patient ID: Lauren Park, female    DOB: July 25, 1995, 26 y.o.   MRN: [id number]  HPI The patient comes in today for a wellness visit.   A review of their health history was completed.  A review of medications was also completed.  Any needed refills; no  Eating habits: fair  Falls/  MVA accidents in past few months: no  Regular exercise: on feet all day at work  Specialist pt sees on regular basis: no  Preventative health issues were discussed.   Additional concerns:   Patient states that she has had a period since July 18, 2020.  Patient had Nexplanon placed in 2020.  Patient states that [place] worked well for the first 2 years however around January 2022 she started having periods that she describes as medium to heavy.  Nexplanon was due to be removed January 2023 however patient just recently got insurance again.  Patient states she has not been sexually active since January 2021.  She was tested for STDs around the same time.  Patient also would like a Pap today.  Patient states that her last Pap was in 2021.  Patient states that Pap took place at [facility] and their [facility]  They recommended that patient follow-up in a year.  Patient remembers them saying something about abnormal cells however she was unable to follow-up the following year because she lost her insurance.  Patient also concerned about vivid dreams which she states interrupts her sleep.  Patient states she does not have any issues going to sleep however she will have very vivid dreams.  Patient has history of depression and anxiety.  Patient denies feeling depressed currently but does state that she has a lot of anxiety.  Patient denies wanting to hurt herself or anyone else.   Review of Systems  Genitourinary:  Positive for menstrual problem.  All other systems reviewed and are negative.     Objective:   Physical Exam Exam conducted with a chaperone present.   Constitutional:      General: She is not in acute distress.    Appearance: Normal appearance. She is obese. She is not ill-appearing or toxic-appearing.  HENT:     Head: Normocephalic.  Cardiovascular:     Rate and Rhythm: Normal rate and regular rhythm.     Pulses: Normal pulses.     Heart sounds: No murmur heard. Pulmonary:     Effort: Pulmonary effort is normal. No respiratory distress.     Breath sounds: Normal breath sounds. No wheezing.  Chest:  Breasts:    Right: Normal. No swelling, bleeding, inverted nipple, mass, nipple discharge, skin change or tenderness.     Left: Normal. No swelling, bleeding, inverted nipple, mass, nipple discharge, skin change or tenderness.     Comments: Small pea-sized lump noted to right breast in the 1:00 area approximately 0.4 cm x 0.4 cm.  Nonfixed, nontender, well-circumscribed. Abdominal:     General: Abdomen is flat. Bowel sounds are normal. There is no distension.     Palpations: Abdomen is soft. There is no mass.     Tenderness: There is no abdominal tenderness. There is no guarding or rebound.     Hernia: No hernia is present. There is no hernia in the left inguinal area or right inguinal area.  Genitourinary:    Exam position: Lithotomy position.     Pubic Area: No rash or pubic lice.      Labia:  Right: No rash, tenderness, lesion or injury.        Left: No rash, tenderness, lesion or injury.      Urethra: No prolapse, urethral pain, urethral swelling or urethral lesion.     Vagina: Bleeding present.     Cervix: Cervical bleeding present. No cervical motion tenderness, discharge, friability, lesion, erythema or eversion.     Uterus: Normal. Not deviated, not enlarged, not fixed, not tender and no uterine prolapse.      Adnexa: Right adnexa normal and left adnexa normal.       Right: No mass, tenderness or fullness.         Left: No mass, tenderness or fullness.       Rectum: Normal.     Comments: Menstrual bleeding. Anterior  tilting uterus. Musculoskeletal:        General: Normal range of motion.     Cervical back: Normal range of motion. No rigidity or tenderness.  Lymphadenopathy:     Cervical: No cervical adenopathy.     Lower Body: No right inguinal adenopathy. No left inguinal adenopathy.  Skin:    General: Skin is warm.     Comments: Nexplanon noted in the left upper  Neurological:     General: No focal deficit present.     Mental Status: She is alert and oriented to person, place, and time.  Psychiatric:        Mood and Affect: Mood normal.        Behavior: Behavior normal.          Assessment & Plan:  1. Women's annual routine gynecological examination Adult wellness-complete.wellness physical was conducted today. Importance of diet and exercise were discussed in detail.  In addition to this a discussion regarding safety was also covered. We also reviewed over immunizations and gave recommendations regarding current immunization needed for age.  In addition to this additional areas were also touched on including: Preventative health exams needed:  Colonoscopy not indicated HIV testing done today Hep C testing done today PAP done today  Patient was advised yearly wellness exam  - HIV antibody (with reflex) - Hepatitis C Antibody - Lipid Profile - CMP14+EGFR  2. Cervical cancer screening - PAP collected today. -Notify patient that there may be a chance that Pap may come back nonconclusive due to menstrual bleeding.  Patient stated understanding  3. Menorrhagia with irregular cycle -Likely related to Nexplanon. - We will start on estrogen containing birth control to help stabilize uterine lining. - We will refer to OB/GYN for Nexplanon removal. - norgestimate-ethinyl estradiol (ORTHO-CYCLEN, 28,) 0.25-35 MG-MCG tablet; Take 1 tablet by mouth daily.  Dispense: 28 tablet; Refill: 11 - Ambulatory referral to Obstetrics / Gynecology - CBC with Differential to evaluate presence of  anemia  4. Mass of upper inner quadrant of right breast -Likely benign cystic mass -Offered mammogram and possible ultrasound.  However patient concerned about lack of finances and the insurance will cover evaluation and not.  Joint decision-making, patient to come back in 3 months for reevaluation. -If area still present will refer patient for mammogram and/or ultrasound. - Return to clinic in 3 months for reevaluation  5. Bad dreams -Likely related to history of anxiety. - Offered Zoloft however patient declined today.  Patient would prefer to speak with psychiatrist/counselor first. - Ambulatory referral to Psychiatry - GAD-7 =13 today - PHQ-9 = 11 today -No thoughts of hurting self or anyone else

## 2021-08-04 LAB — HIV ANTIBODY (ROUTINE TESTING W REFLEX): HIV Screen 4th Generation wRfx: NONREACTIVE

## 2021-08-04 LAB — CBC WITH DIFFERENTIAL/PLATELET
Basophils Absolute: 0 10*3/uL (ref 0.0–0.2)
Basos: 0 %
EOS (ABSOLUTE): 0 10*3/uL (ref 0.0–0.4)
Eos: 0 %
Hematocrit: 40.7 % (ref 34.0–46.6)
Hemoglobin: 13.3 g/dL (ref 11.1–15.9)
Immature Grans (Abs): 0 10*3/uL (ref 0.0–0.1)
Immature Granulocytes: 0 %
Lymphocytes Absolute: 3.4 10*3/uL — ABNORMAL HIGH (ref 0.7–3.1)
Lymphs: 37 %
MCH: 24.4 pg — ABNORMAL LOW (ref 26.6–33.0)
MCHC: 32.7 g/dL (ref 31.5–35.7)
MCV: 75 fL — ABNORMAL LOW (ref 79–97)
Monocytes Absolute: 0.5 10*3/uL (ref 0.1–0.9)
Monocytes: 6 %
Neutrophils Absolute: 5.1 10*3/uL (ref 1.4–7.0)
Neutrophils: 57 %
Platelets: 368 10*3/uL (ref 150–450)
RBC: 5.46 x10E6/uL — ABNORMAL HIGH (ref 3.77–5.28)
RDW: 15.3 % (ref 11.7–15.4)
WBC: 9.1 10*3/uL (ref 3.4–10.8)

## 2021-08-04 LAB — CMP14+EGFR
ALT: 11 IU/L (ref 0–32)
AST: 15 IU/L (ref 0–40)
Albumin/Globulin Ratio: 1.6 (ref 1.2–2.2)
Albumin: 4.8 g/dL (ref 3.9–5.0)
Alkaline Phosphatase: 93 IU/L (ref 44–121)
BUN/Creatinine Ratio: 17 (ref 9–23)
BUN: 13 mg/dL (ref 6–20)
Bilirubin Total: 0.3 mg/dL (ref 0.0–1.2)
CO2: 21 mmol/L (ref 20–29)
Calcium: 9.8 mg/dL (ref 8.7–10.2)
Chloride: 102 mmol/L (ref 96–106)
Creatinine, Ser: 0.77 mg/dL (ref 0.57–1.00)
Globulin, Total: 3 g/dL (ref 1.5–4.5)
Glucose: 76 mg/dL (ref 70–99)
Potassium: 3.7 mmol/L (ref 3.5–5.2)
Sodium: 137 mmol/L (ref 134–144)
Total Protein: 7.8 g/dL (ref 6.0–8.5)
eGFR: 108 mL/min/{1.73_m2} (ref >59)

## 2021-08-04 LAB — HEPATITIS C ANTIBODY: Hep C Virus Ab: <0.1 s/co ratio (ref 0.0–0.9)

## 2021-08-04 LAB — LIPID PANEL
Chol/HDL Ratio: 4.2 ratio (ref 0.0–4.4)
Cholesterol, Total: 155 mg/dL (ref 100–199)
HDL: 37 mg/dL — ABNORMAL LOW (ref >39)
LDL Chol Calc (NIH): 97 mg/dL (ref 0–99)
Triglycerides: 117 mg/dL (ref 0–149)
VLDL Cholesterol Cal: 21 mg/dL (ref 5–40)

## 2021-08-04 NOTE — Progress Notes (Signed)
Nurses please call the patient with the following message:  Overall your labs look good.  Your good cholesterol is a little low.  We addressed this by eating foods that are high in good cholesterol/fats.  Those foods include olive oils, fish (like salmon), and avocados.  Blood counts look well.  No signs of anemia.  Some minor variations noted but I am not concerned.  Can recheck next year.  HPV and HIV all negative.  Your electrolytes, kidney, liver are all doing well.  Look forward to seeing you in 3 months.

## 2021-08-05 LAB — IGP, RFX APTIMA HPV ASCU

## 2021-08-15 ENCOUNTER — Other Ambulatory Visit: Payer: Self-pay | Admitting: Nurse Practitioner

## 2021-08-15 ENCOUNTER — Telehealth: Payer: Self-pay | Admitting: Nurse Practitioner

## 2021-08-15 DIAGNOSIS — G479 Sleep disorder, unspecified: Secondary | ICD-10-CM

## 2021-08-15 NOTE — Telephone Encounter (Signed)
Pt called and said it has been a week and she has not received a referral to a psychiatrist.   [phone]

## 2021-08-15 NOTE — Telephone Encounter (Signed)
[facility], Trenton Gammon, NP    Another referral has been sent.   Barbee Shropshire

## 2021-08-15 NOTE — Telephone Encounter (Signed)
Left message to return call 

## 2021-08-16 NOTE — Telephone Encounter (Signed)
Left message to return call 

## 2021-08-17 NOTE — Telephone Encounter (Signed)
Spoke with pt today and message referral coordinator. Toni Amend will begin to work on this. Informed pt to call us back if she has not heard anything in the next week or so. Pt verbalized understanding

## 2021-09-14 ENCOUNTER — Other Ambulatory Visit: Payer: Self-pay

## 2021-09-14 ENCOUNTER — Ambulatory Visit: Payer: 59 | Admitting: Advanced Practice Midwife

## 2021-09-14 ENCOUNTER — Encounter: Payer: Self-pay | Admitting: Advanced Practice Midwife

## 2021-09-14 VITALS — BP 116/78 | HR 74 | Ht 68.0 in | Wt 272.0 lb

## 2021-09-14 DIAGNOSIS — Z3046 Encounter for surveillance of implantable subdermal contraceptive: Secondary | ICD-10-CM

## 2021-09-14 NOTE — Progress Notes (Signed)
? ?  NEXPLANON REMOVAL ?Patient name: Lauren Park MRN [id number]  Date of birth: 03-26-1996 ?Subjective Findings:   ?Lauren Park is a 26 y.o. G0P0000 Caucasian female being seen today for removal of a Nexplanon. Her Nexplanon was placed in 2020.  She desires removal because it expires next month/the breakthrough bldg has gotten worse; she is already taking OCPs from her PCP. Signed copy of informed consent in chart.  ? ?No LMP recorded. ?Last pap Jan 2023. Results were: NILM w/ HRHPV not done ?The planned method of family planning is abstinence, with OCPs for cycle control ? ?Depression screen [facility] 2/9 08/03/2021 08/03/2021 11/20/2016 11/20/2016  ?Decreased Interest 1 0 1 1  ?Down, Depressed, Hopeless 1 0 2 2  ?PHQ - 2 Score 2 0 3 3  ?Altered sleeping 1 - 2 -  ?Tired, decreased energy 2 - 2 -  ?Change in appetite 2 - 2 -  ?Feeling bad or failure about yourself  2 - 1 -  ?Trouble concentrating 1 - 0 -  ?Moving slowly or fidgety/restless 0 - 0 -  ?Suicidal thoughts 1 - 0 -  ?PHQ-9 Score 11 - 10 -  ?Difficult doing work/chores Somewhat difficult - - -  ? ?  ?GAD 7 : Generalized Anxiety Score 08/03/2021  ?Nervous, Anxious, on Edge 2  ?Control/stop worrying 2  ?Worry too much - different things 2  ?Trouble relaxing 1  ?Restless 1  ?Easily annoyed or irritable 3  ?Afraid - awful might happen 2  ?Total GAD 7 Score 13  ?Anxiety Difficulty Very difficult  ? ? ? ?Pertinent History Reviewed:   ?Reviewed past medical,surgical, social, obstetrical and family history.  ?Reviewed problem list, medications and allergies. ?Objective Findings & Procedure:   ? ?Vitals:  ? 09/14/21 1418  ?BP: 116/78  ?Pulse: 74  ?Weight: 272 lb (123.4 kg)  ?Height: 5\' 8"  (1.727 m)  ?Body mass index is 41.36 kg/m?. ? ?No results found for this or any previous visit (from the past 24 hour(s)).  ? ?Time out was performed. ? ?Nexplanon site identified.  Area prepped in usual sterile fashon. One cc of 2% lidocaine was used to anesthetize the area at the  distal end of the implant. A small stab incision was made right beside the implant on the distal portion.  The Nexplanon rod was grasped using hemostats and removed without difficulty.  There was less than 3 cc blood loss. There were no complications.  Steri-strips were applied over the small incision and a pressure bandage was applied.  The patient tolerated the procedure well. ?Assessment & Plan:   ?1) Nexplanon removal ?She was instructed to keep the area clean and dry, remove pressure bandage in 24 hours, and keep insertion site covered with the steri-strip for 3-5 days.   ?Follow-up PRN problems. ? ?No orders of the defined types were placed in this encounter. ? ? ?Follow-up: Return for prn. ? ? CNM ?09/14/2021 ?3:00 PM  ?

## 2021-09-21 ENCOUNTER — Encounter: Payer: Self-pay | Admitting: Nurse Practitioner

## 2021-09-21 ENCOUNTER — Ambulatory Visit (INDEPENDENT_AMBULATORY_CARE_PROVIDER_SITE_OTHER): Payer: 59 | Admitting: Nurse Practitioner

## 2021-09-21 ENCOUNTER — Other Ambulatory Visit: Payer: Self-pay

## 2021-09-21 VITALS — BP 150/78 | HR 89 | Temp 97.7°F | Ht 68.0 in | Wt 275.2 lb

## 2021-09-21 DIAGNOSIS — G479 Sleep disorder, unspecified: Secondary | ICD-10-CM | POA: Diagnosis not present

## 2021-09-21 NOTE — Progress Notes (Signed)
? ?  Subjective:  ? ? Patient ID: Lauren Park, female    DOB: [birth year], 26 y.o.   MRN: [id number] ? ?HPI ? ?26 year old female patient with history of headaches, migraines, sleep disturbances presents to the clinic with a request for a  referral for a sleep study.  During last visit patient was referred to psychiatry for sleep disturbances.  Patient states that the psychiatrist believes that patient may have a disruption to her sleep cycles which is causing more vivid dreams. ? ?Patient admits to a decreased quality of sleep, feeling exhausted in the mornings after sleep, mild headaches in the morning, and waking up nauseous.  Patient is unsure if she snores however does note that she occasionally snores depending on the position of her body.  Patient states that family members have noted that she will hyperventilate in her sleep as well as talk in her sleep. ? ?Patient states that she has attempted to decrease caffeine before bed, decrease eating before bed, decreased electronics before bed all without relief. ? ?Review of Systems  ?Neurological:   ?     Vivid dreams.  ? ?   ?Objective:  ? Physical Exam ?Constitutional:   ?   General: She is not in acute distress. ?   Appearance: Normal appearance. She is obese. She is not ill-appearing, toxic-appearing or diaphoretic.  ?Cardiovascular:  ?   Rate and Rhythm: Normal rate and regular rhythm.  ?   Pulses: Normal pulses.  ?   Heart sounds: Normal heart sounds. No murmur heard. ?Pulmonary:  ?   Effort: Pulmonary effort is normal.  ?   Breath sounds: Normal breath sounds.  ?Musculoskeletal:     ?   General: Normal range of motion.  ?   Cervical back: Normal range of motion and neck supple. No rigidity or tenderness.  ?Lymphadenopathy:  ?   Cervical: No cervical adenopathy.  ?Skin: ?   General: Skin is warm.  ?   Capillary Refill: Capillary refill takes less than 2 seconds.  ?Neurological:  ?   General: No focal deficit present.  ?   Mental Status: She is alert and  oriented to person, place, and time.  ?   Cranial Nerves: No cranial nerve deficit.  ?   Sensory: No sensory deficit.  ?   Motor: No weakness.  ?   Coordination: Coordination normal.  ?   Gait: Gait normal.  ?Psychiatric:     ?   Mood and Affect: Mood normal.     ?   Behavior: Behavior normal.  ? ? ? ? ? ?   ?Assessment & Plan:  ? ?1. Sleep disturbance ?-Patient to be sent for sleep study to evaluate changes to her sleep cycle which may cause vivid dreams. ?-I assume it is possible to have sleep disturbances secondary to sleep apnea.  Sleep study will be useful to rule out sleep apnea as cause of sleep disturbances. ?- Ambulatory referral to Sleep Studies ?-If sleep study is nonconclusive we will consider starting patient on prazosin or trazodone to help with sleep. ?-Continue with psychiatrist as scheduled. ?-Continue to minimize screen use, caffeine intake, and food intake and practice good sleep hygiene. ?-Return to clinic if issue persist or worsens. ?  ?Note:  This document was prepared using Dragon voice recognition software and may include unintentional dictation errors. ? ? ?

## 2021-11-02 ENCOUNTER — Ambulatory Visit: Payer: 59 | Admitting: Nurse Practitioner

## 2021-11-23 ENCOUNTER — Ambulatory Visit (INDEPENDENT_AMBULATORY_CARE_PROVIDER_SITE_OTHER): Payer: 59 | Admitting: Neurology

## 2021-11-23 ENCOUNTER — Encounter: Payer: Self-pay | Admitting: Neurology

## 2021-11-23 VITALS — BP 131/75 | HR 77 | Ht 68.0 in | Wt 270.0 lb

## 2021-11-23 DIAGNOSIS — R442 Other hallucinations: Secondary | ICD-10-CM | POA: Diagnosis not present

## 2021-11-23 DIAGNOSIS — G4730 Sleep apnea, unspecified: Secondary | ICD-10-CM

## 2021-11-23 DIAGNOSIS — F518 Other sleep disorders not due to a substance or known physiological condition: Secondary | ICD-10-CM

## 2021-11-23 DIAGNOSIS — G4710 Hypersomnia, unspecified: Secondary | ICD-10-CM

## 2021-11-23 DIAGNOSIS — U099 Post covid-19 condition, unspecified: Secondary | ICD-10-CM

## 2021-11-23 DIAGNOSIS — G9331 Postviral fatigue syndrome: Secondary | ICD-10-CM

## 2021-11-23 DIAGNOSIS — E66813 Obesity, class 3: Secondary | ICD-10-CM

## 2021-11-23 DIAGNOSIS — G478 Other sleep disorders: Secondary | ICD-10-CM

## 2021-11-23 NOTE — Progress Notes (Signed)
? ? ?SLEEP MEDICINE CLINIC ?  ? ?Provider:  Melvyn Novas, MD  ?Primary Care Physician:  [facility], Alvino Chapel, NP ?[address] ?[place] [address]  ? ?  ?Referring Provider: Deneise Lever, Np ?[address] ?[address] ?[place],  [place] [address]  ?  ?  ?    ?Chief Complaint according to patient   ?Patient presents with:  ?  ? New Patient (Initial Visit)  ?     ?  ?  ?HISTORY OF PRESENT ILLNESS:  ?Lauren Park is a 26 y.o. year old White or Caucasian female patient seen here as a referral on 11/23/2021 from NP [facility] for a Sleep evaluation.  ?Chief concern according to patient :  Rm , alone. Pt referred for decreased quality of sleep, feeling exhausted in the mornings after sleep, mild headaches in the morning, waking up nauseated, and since COVID having vivid dreams every night. Vivid dreams became daily after having COVID 2 years ago. Fatigued more than sleepiness.  ? Patient is aware of talking and moving some at night. Sleeps on her side and has to constantly turn sides to avoid neck pn. Has been having leg cramps at night usually w bad dreams. Pt unsure if she snores. She lives with her father, mother died years ago.  ? Lauren Park  is here to be evaluated for a possible sleep disorder.  She has recently seen a Therapist, sports for anxiety.  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 ?  ?Sleep relevant medical history: Vivid dreams, falling asleep easily, but dreams interrupt sleep, dreams are starting as soon as she is drowsy,  in installments, but not lucid dreaming. ?   ? Family medical /sleep history: Father with OSA, 2 younger siblings have no sleep issues. Brother is a lucid dreamer  ?  ?Social history:  Patient is working as a Recruitment consultant and lives in a household with step father  . ?The patient currently works day shift. Pets : one dog and one cat . ?Tobacco use- none .  ETOH use- none ,  ?Caffeine intake in  form of Coffee( /) Soda( 5/ week) Tea ( /) no energy drinks. ?Regular exercise - none.   ?  ?  ?Sleep habits are as follows: The patient's dinner time is between 6.30- 8 PM.  ?The patient goes to bed at 10.30-11 PM and continues to sleep for 6-8 hours. ?The preferred sleep position is variable , with the support of 1-2 pillows. Dreams are reportedly frequent/vivid- every night .  ?6.30  AM is the usual rise time. She reports not feeling refreshed or restored in AM, with symptoms such morning headaches, and residual fatigue.  ?Naps are taken frequently, during lunch breaks, 20-30 minutes- refreshing  for 1-2 hours.  ?  ?Review of Systems: ?Out of a complete 14 system review, the patient complains of only the following symptoms, and all other reviewed systems are negative.:  ?Fatigue, sleepiness , snoring, fragmented sleep, VIVID DREAMS.  ? ?Sleep startling. No sleep paralysis, hypnagogic and hypnopompic hallucinations.  ?No cataplexy.  ? ?  ?How likely are you to doze in the following situations: ?0 = not likely, 1 = slight chance, 2 = moderate chance, 3 = high chance ?  ?Sitting and Reading? ?Watching Television? ?Sitting inactive in a public place (theater or meeting)? ?As a passenger in a car for an hour without a break? ?Lying down  in the afternoon when circumstances permit? ?Sitting and talking to someone? ?Sitting quietly after lunch without alcohol? ?In a car, while stopped for a few minutes in traffic? ?  ?Total = 7/ 24 points  but daily power naps.  ? FSS endorsed at 34/ 63 points.  ? ?Social History  ? ?Socioeconomic History  ? Marital status: Single  ?  Spouse name: Not on file  ? Number of children: Not on file  ? Years of education: Not on file  ? Highest education level: Bachelor's degree (e.g., BA, AB, BS)  ?Occupational History  ? Not on file  ?Tobacco Use  ? Smoking status: Never  ? Smokeless tobacco: Never  ?Vaping Use  ? Vaping Use: Never used  ?Substance and Sexual Activity  ? Alcohol use: No  ?  Drug use: No  ? Sexual activity: Not Currently  ?  Birth control/protection: Implant, Pill  ?Other Topics Concern  ? Not on file  ?Social History Narrative  ? Lives with father  ? R handed  ? Caffeine: 4-5 16oz cans a week.   ? ?Social Determinants of Health  ? ?Financial Resource Strain: Not on file  ?Food Insecurity: Not on file  ?Transportation Needs: Not on file  ?Physical Activity: Not on file  ?Stress: Not on file  ?Social Connections: Not on file  ? ? ?Family History  ?Problem Relation Age of Onset  ? High blood pressure Mother   ? Heart attack Mother   ? Depression Mother   ? Uterine cancer Mother   ? Breast cancer Maternal Uncle   ? Stroke Maternal Grandmother   ? Heart attack Maternal Grandmother   ? Depression Maternal Grandmother   ? High blood pressure Maternal Grandmother   ? Depression Maternal Grandfather   ? High blood pressure Maternal Grandfather   ? Brain cancer Maternal Grandfather   ? ? ?Past Medical History:  ?Diagnosis Date  ? Anterior subluxation of humerus 06/2012  ? left  ? Depression   ? no current med.  ? Migraines   ? Shoulder dislocation 06/2012  ? left  ? ? ?Past Surgical History:  ?Procedure Laterality Date  ? CLOSED REDUCTION SHOULDER DISLOCATION    ? x 2 - under conscious sedation  ? SHOULDER ARTHROSCOPY WITH BANKART REPAIR  07/05/2012  ? Procedure: SHOULDER ARTHROSCOPY WITH BANKART REPAIR;  Surgeon: Eulas Post, MD;  Location: [facility];  Service: Orthopedics;  Laterality: Left;  LEFT SHOULDER  ARTHROSCOPY CAPSULORRHAPHY  ?  ? ?Current Outpatient Medications on File Prior to Visit  ?Medication Sig Dispense Refill  ? norgestimate-ethinyl estradiol (ORTHO-CYCLEN, 28,) 0.25-35 MG-MCG tablet Take 1 tablet by mouth daily. 28 tablet 11  ? ?No current facility-administered medications on file prior to visit.  ? ? ?Allergies  ?Allergen Reactions  ? Insect Extract Allergy Skin Test Swelling  ?  BEES, MOSQUITOES  ? Poison Sumac Extract Rash  ? ? ?Physical  exam: ? ?Today's Vitals  ? 11/23/21 1458  ?BP: 131/75  ?Pulse: 77  ?Weight: 270 lb (122.5 kg)  ?Height: 5\' 8"  (1.727 m)  ? ?Body mass index is 41.05 kg/m?.  ? ?Wt Readings from Last 3 Encounters:  ?11/23/21 270 lb (122.5 kg)  ?09/21/21 275 lb 3.2 oz (124.8 kg)  ?09/14/21 272 lb (123.4 kg)  ?  ? ?Ht Readings from Last 3 Encounters:  ?11/23/21 5\' 8"  (1.727 m)  ?09/21/21 5\' 8"  (1.727 m)  ?09/14/21 5\' 8"  (1.727 m)  ?  ?  ?General: The  patient is awake, alert and appears not in acute distress. The patient is well groomed. ?Head: Normocephalic, atraumatic. Neck is supple.  ?Mallampati 2, large tonsils ?neck circumference:16 inches . Nasal airflow congested, voice is affected   ? Retrognathia is not seen.  ?Dental status: biological  ?Cardiovascular:  Regular rate and cardiac rhythm by pulse,  without distended neck veins. ?Respiratory: Lungs are clear to auscultation.  ?Skin:  Without evidence of ankle edema, or rash. ?Trunk: The patient's posture is erect. ?  ?Neurologic exam : ?The patient is awake and alert, oriented to place and time.   ?Memory subjective described as intact.  ?Attention span & concentration ability appears normal.  ?Speech is fluent,  with  dysphonia . ?Mood and affect are appropriate. ?  ?Cranial nerves: no loss of smell or taste reported  ?Pupils are equal and briskly reactive to light. Funduscopic exam deferred .  ?Extraocular movements in vertical and horizontal planes were intact and without nystagmus. No Diplopia. ?Visual fields by finger perimetry are intact. ?Hearing was intact to soft voice and finger rubbing.   ? Facial sensation intact to fine touch. ? Facial motor strength is symmetric and tongue and uvula move midline.  ?Neck ROM : rotation, tilt and flexion extension were normal for age and shoulder shrug was symmetrical.  ?  ?Motor exam:  Symmetric bulk, tone and ROM.   ?Normal tone without cog wheeling, symmetric grip strength . ?  ?Sensory:  Fine touch, pinprick and vibration were  tested  and  normal.  ?Proprioception tested in the upper extremities was normal. ?  ?Coordination: Rapid alternating movements in the fingers/hands were of normal speed.  ?The Finger-to-nose maneuver was intact without

## 2021-11-28 ENCOUNTER — Telehealth: Payer: Self-pay

## 2021-11-28 NOTE — Telephone Encounter (Signed)
Patient left a voicemail with our office requesting lab results from last week.

## 2021-11-28 NOTE — Telephone Encounter (Signed)
Called pt. Per Poston, test can take 5 business days to process before results are available. Lab result are still pending. Advised we will call her back to go over results once they are back.

## 2021-11-28 NOTE — Telephone Encounter (Signed)
Appears narcolepsy panel lab drawn on 11/23/21. States collected in epic, but no result yet. No results in MD basket. Checking with [facility] to see if result is still pending, waiting on response.

## 2021-12-02 LAB — NARCOLEPSY EVALUATION
DQA1*01:02: POSITIVE
DQB1*06:02: NEGATIVE

## 2021-12-08 ENCOUNTER — Encounter: Payer: Self-pay | Admitting: Neurology

## 2021-12-08 ENCOUNTER — Telehealth: Payer: Self-pay | Admitting: Neurology

## 2021-12-08 NOTE — Telephone Encounter (Signed)
LVM for pt to call back to schedule \ Friday health auth: [id number] (exp. 12/07/21 to 03/09/22)

## 2021-12-13 NOTE — Telephone Encounter (Signed)
LVM for pt to call back to schedule.

## 2022-04-11 ENCOUNTER — Telehealth: Payer: Self-pay | Admitting: Neurology

## 2022-04-11 NOTE — Telephone Encounter (Signed)
[employer] pending uploaded notes on the portal  

## 2022-04-20 ENCOUNTER — Encounter: Payer: Self-pay | Admitting: Neurology

## 2022-04-24 NOTE — Telephone Encounter (Signed)
Patient returned my call.   She is scheduled at [facility] for 06/11/22 and 06/12/22.  Mailed packet to the patient.

## 2022-04-24 NOTE — Telephone Encounter (Signed)
Holland Falling Josem Kaufmann: [id number] (exp. 04/11/22 to 10/08/22) Josem Kaufmann: [id number] (Exp. 04/11/22 to 10/08/22)  Lvm for pt to call back to schedule.

## 2022-06-11 ENCOUNTER — Ambulatory Visit (INDEPENDENT_AMBULATORY_CARE_PROVIDER_SITE_OTHER): Payer: 59 | Admitting: Neurology

## 2022-06-11 DIAGNOSIS — U099 Post covid-19 condition, unspecified: Secondary | ICD-10-CM

## 2022-06-11 DIAGNOSIS — G4711 Idiopathic hypersomnia with long sleep time: Secondary | ICD-10-CM

## 2022-06-11 DIAGNOSIS — F518 Other sleep disorders not due to a substance or known physiological condition: Secondary | ICD-10-CM

## 2022-06-11 DIAGNOSIS — G4710 Hypersomnia, unspecified: Secondary | ICD-10-CM

## 2022-06-11 DIAGNOSIS — G9331 Postviral fatigue syndrome: Secondary | ICD-10-CM

## 2022-06-11 DIAGNOSIS — G478 Other sleep disorders: Secondary | ICD-10-CM

## 2022-06-11 DIAGNOSIS — R442 Other hallucinations: Secondary | ICD-10-CM

## 2022-06-11 DIAGNOSIS — G4720 Circadian rhythm sleep disorder, unspecified type: Secondary | ICD-10-CM

## 2022-06-12 ENCOUNTER — Other Ambulatory Visit: Payer: Self-pay | Admitting: *Deleted

## 2022-06-12 ENCOUNTER — Other Ambulatory Visit: Payer: 59

## 2022-06-12 ENCOUNTER — Ambulatory Visit: Payer: 59 | Admitting: Neurology

## 2022-06-12 DIAGNOSIS — R442 Other hallucinations: Secondary | ICD-10-CM

## 2022-06-12 DIAGNOSIS — G478 Other sleep disorders: Secondary | ICD-10-CM

## 2022-06-12 DIAGNOSIS — F518 Other sleep disorders not due to a substance or known physiological condition: Secondary | ICD-10-CM

## 2022-06-12 DIAGNOSIS — G4710 Hypersomnia, unspecified: Secondary | ICD-10-CM

## 2022-06-12 DIAGNOSIS — G4730 Sleep apnea, unspecified: Secondary | ICD-10-CM | POA: Diagnosis not present

## 2022-06-12 DIAGNOSIS — U099 Post covid-19 condition, unspecified: Secondary | ICD-10-CM

## 2022-06-12 DIAGNOSIS — Z0289 Encounter for other administrative examinations: Secondary | ICD-10-CM

## 2022-06-12 DIAGNOSIS — G9331 Postviral fatigue syndrome: Secondary | ICD-10-CM

## 2022-06-14 LAB — DRUG SCREEN, URINE
Amphetamines, Urine: NEGATIVE ng/mL
Barbiturate screen, urine: NEGATIVE ng/mL
Benzodiazepine Quant, Ur: NEGATIVE ng/mL
Cannabinoid Quant, Ur: NEGATIVE ng/mL
Cocaine (Metab.): NEGATIVE ng/mL
Opiate Quant, Ur: NEGATIVE ng/mL
PCP Quant, Ur: NEGATIVE ng/mL

## 2022-06-21 NOTE — Procedures (Signed)
Physician Interpretation:     [facility] POLYSOMNOGRAPHY  INTERPRETATION REPORT   STUDY DATE:  06/11/2022     PATIENT NAME:  Lauren Park         DATE OF BIRTH:  02-25-1996  PATIENT ID:  [id number]    TYPE OF STUDY:  PSG  READING PHYSICIAN: Star Age, MD, PhD (read for Dr. Brett Fairy) REFERRED BY: Mariane Baumgarten, NP SCORING TECHNICIAN: Richard Miu, RPSGT  HISTORY:  History: 26 year old female with an underlying medical history of menorrhagia, and obesity, who reports vivid dreams and excessive daytime somnolence. She presents for an overnight sleep study with next day nap testing. A UDS on 06/12/2022 was negative. 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  DQA1*01:02 Positive  The Epworth Sleepiness Score is 7/24. Height: 68 in Weight: 270 lb (BMI 41) Neck Size: 16 in   MEDICATIONS: Ortho-Cyclen  TECHNICAL DESCRIPTION: A registered sleep technologist was in attendance for the duration of the recording.  Data collection, scoring, video monitoring, and reporting were performed in compliance with the AASM Manual for the Scoring of Sleep and Associated Events; (Hypopnea is scored based on the criteria listed in Section VIII D. 1b in the AASM Manual V2.6 using a 4% oxygen desaturation rule or Hypopnea is scored based on the criteria listed in Section VIII D. 1a in the AASM Manual V2.6 using 3% oxygen desaturation and /or arousal rule).   SLEEP CONTINUITY AND SLEEP ARCHITECTURE:  Lights-out was at 20:42: and lights-on at  06:30:, with  a total recording time of 9 hours, 47.5 min. Total sleep time ( TST) was 545.5 minutes with a normal sleep efficiency at 92.9%.    BODY POSITION:  TST was divided  between the following sleep positions: 99.9% supine;  0.1% lateral;  0% prone. Duration of total sleep and percent of total sleep in their respective position is as follows: supine 545 minutes (100%), non-supine 1 minutes (0%); right 00  minutes (0%), left 00 minutes (0%), and prone 00 minutes (0%).  Total supine REM sleep time was 94 minutes (100% of total REM sleep). Sleep latency was normal at 18.0 minutes.  REM sleep latency was normal at 83.5 minutes. Of the total sleep time, the percentage of stage N1 sleep was 2.7%, stage N2 sleep was 75%, which is increased, stage N3 sleep was 4.9%, which is reduced, and REM sleep was 17.2%, which is near-normal (slightly reduced). Wake after sleep onset (WASO) time accounted for 24 minutes with minimal to mild sleep fragmentation noted.   RESPIRATORY MONITORING:  Based on CMS criteria (using a 4% oxygen desaturation rule for scoring hypopneas), there were 0 apneas (0 obstructive; 0 central; 0 mixed), and 4 hypopneas.  Apnea index was 0.0. Hypopnea index was 0.4. The apnea-hypopnea index was 0.4/hour overall (0.3 supine, 0 non-supine; 0.6 REM, 0.6 supine REM). There were 0 respiratory effort-related arousals (RERAs).  The RERA index was 0 events/h. Total respiratory disturbance index (RDI) was 0.4 events/h. RDI results showed: supine RDI  0.3 /h; non-supine RDI 120.0 /h; REM RDI 0.6 /h, supine REM RDI 0.6 /h.   Based on AASM criteria (using a 3% oxygen desaturation and /or arousal rule for scoring hypopneas), there were 0 apneas (0 obstructive; 0 central; 0 mixed), and 8 hypopneas. Apnea index was 0.0. Hypopnea index was 0.9. The apnea-hypopnea index was 0.9 overall (0.8 supine, 0 non-supine; 0.6 REM, 0.6 supine REM). There were 0 respiratory effort-related arousals (RERAs).  The RERA index  was 0 events/h. Total respiratory disturbance index (RDI) was 0.9 events/h. RDI results showed: supine RDI  0.8 /h; non-supine RDI 120.0 /h; REM RDI 0.6 /h, supine REM RDI 0.6 /h.   OXIMETRY: Oxyhemoglobin Saturation Nadir during sleep was at 88% - 71% on the technical report was an error during wakefulness) from a mean of 96%.  Of the Total sleep time (TST)   hypoxemia (=<88%) was present for  0.1 minutes, or  0.0% of total sleep time.   LIMB MOVEMENTS: There were 0 periodic limb movements of sleep (0.0/hr), of which 0 (0.0/hr) were associated with an arousal.  AROUSAL: There were 70 arousals in total, for an arousal index of 8 arousals/hour.  Of these, 6 were identified as respiratory-related arousals (1 /h), 0 were PLM-related arousals (0 /h), and 75 were non-specific arousals (8 /h).  EEG: Review of the EEG showed no abnormal electrical discharges and symmetrical bihemispheric findings.    EKG: The EKG revealed normal sinus rhythm (NSR). The average heart rate during sleep was 66 bpm.   AUDIO/VIDEO REVIEW: The audio and video review did not show any abnormal or unusual behaviors, movements, phonations or vocalizations. The patient took no bathroom breaks.  No audible snoring was detected.  POST-STUDY QUESTIONNAIRE: Post study, the patient indicated, that sleep was the same as usual. The patient stated that she had different dreams each time she woke up and only remembered her last dream. The patient had a nap study, MSLT, next day on 06/12/2022 with a mean sleep latency of 7.7 minutes for 5 naps and no SOREMPs (sleep onset REM periods) noted.   IMPRESSION:  1. Idiopathic Hypersomnolence 2. Dysfunctions associated with sleep stages or arousal from sleep  RECOMMENDATIONS:  1. The overnight sleep study and next day nap study indicate no significantly sleep disordered breathing. The low mean sleep latency (7.7 min) and 0 SOREMPs (sleep onset REM periods) for the nap study indicate significant daytime somnolence, but diagnostic criteria for narcolepsy are not fulfilled. These findings, along with the patients clinical history would support the diagnosis of idiopathic hypersomnolence.  Clinical correlation is recommended.  Symptomatic treatment options should be discussed with the patient. The patient will be advised to resume her medication regimen.  2. The patient should be cautioned not to drive, work  at heights, or operate dangerous or heavy equipment when tired or sleepy. Review and reiteration of good sleep hygiene measures should be pursued with any patient. 3. The nocturnal polysomnogram shows sleep fragmentation and abnormal sleep stage percentages; these are nonspecific findings and per se do not signify an intrinsic sleep disorder or a cause for the patient's sleep-related symptoms. Causes include (but are not limited to) the first night effect of the sleep study, circadian rhythm disturbances, medication effect or an underlying mood disorder or medical problem.  4. The patient will be seen in follow-up in the sleep clinic at [facility] for discussion of the test results, symptom and treatment compliance review, further management strategies, etc. The referring provider will be notified of the test results.   I certify that I have reviewed the entire raw data recording prior to the issuance of this report in accordance with the Standards of Accreditation of the American Academy of Sleep Medicine (AASM).  Star Age, MD, PhD Medical Director, [facility]) [place], ABPN (Neurology and Sleep)              MSLT Interpretation Report:   [facility]  MSLT Report    Name: Lauren Park, Lauren Park Reference 0  Study Date: 06/12/2022 Procedure #: 0 DOB: [birth year]  Protocol This is a 13 channel Multiple Sleep Latency Test comprised of 5 channels of EEG (T3-Cz, Cz-T4, F4-M1, C4-M1, O2-M1). 3 channels of Chin EMG, 4 channels of EOG and 1 channel for ECG. All channels were sampled at 256 Hz.  This polysomnographic procedure is designed to evaluate (1) the complaint of excessive daytime sleepiness by quantifying the time required to fall asleep and (2) the possibility of narcolepsy by checking for abnormally short latencies to REM sleep. Electrographic variables include EEG, EMG, EOG and ECG. Patients are monitored throughout four  or five 20-minute opportunities to sleep (naps) at two-hour intervals. For each nap, the patient is allowed 20 minutes to fall asleep. Once asleep, the patient is awakened after 15 minutes. Between naps, the patient is kept as alert as possible. A sleep latency of 20 minutes indicates that no sleep occurred. Parametric Analysis Total Number of Naps 5   NAP # Time of Nap Sleep Latency (mins) REM Latency (mins) Sleep Time Percent Awake Time Percent   1 08:16 8.5 20.0 64 36    2 08:44 9.5 20.0 52 48    3 09:12 6.5 20.0 69 31    4 09:36 4.5 20.0 76 24    5 09:58 9.5 20.0 60 40     MSLT Summary of Naps  Sleepiness Index: 61.5  Mean Sleep Latency: 7.7  Number of Naps with REM Sleep: 0   Results from Preceding PSG Study  Sleep Onset Time   Sleep Efficiency (%) 92.9%  Rise Time   Sleep Latency (min) 9 min  Total Sleep Time 9 h, 5.5 min REM Latency (min) 83.5 min   Name: Hero, Mccathern Reference 0  Study Date: 06/12/2022 Procedure #: 0 DOB: July 15, 1995   IMPRESSION:  1. Idiopathic Hypersomnolence 2. Dysfunctions associated with sleep stages or arousal from sleep  RECOMMENDATIONS:  1. The overnight sleep study and next day nap study indicate no significantly sleep disordered breathing. The low mean sleep latency (7.7 min) and 0 SOREMPs (sleep onset REM periods) for the nap study indicate significant daytime somnolence, but diagnostic criteria for narcolepsy are not fulfilled. These findings, along with the patients clinical history would support the diagnosis of idiopathic hypersomnolence.  Clinical correlation is recommended.  Symptomatic treatment options should be discussed with the patient. The patient will be advised to resume her medication regimen.  2. The patient should be cautioned not to drive, work at heights, or operate dangerous or heavy equipment when tired or sleepy. Review and reiteration of good sleep hygiene measures should be pursued with any patient. 3. The nocturnal  polysomnogram shows sleep fragmentation and abnormal sleep stage percentages; these are nonspecific findings and per se do not signify an intrinsic sleep disorder or a cause for the patient's sleep-related symptoms. Causes include (but are not limited to) the first night effect of the sleep study, circadian rhythm disturbances, medication effect or an underlying mood disorder or medical problem.  4. The patient will be seen in follow-up in the sleep clinic at [facility] for discussion of the test results, symptom and treatment compliance review, further management strategies, etc. The referring provider will be notified of the test results.   I certify that I have reviewed the entire raw data recording prior to the issuance of this report in accordance with the Standards of Accreditation of the American Academy of Sleep Medicine (AASM). Star Age,  MD, PhD [facility]) [place], ABPN (Neurology and Sleep)    Technical Report:   General Information  Name: Lauren Park, Lauren Park BMI: 27.51 Physician: Star Age, MD  ID: [id number] Height: 68.0 in Technician: Richard Miu, RPSGT  Sex: Female Weight: 270.0 lb Record: [id number]  Age: 38 [Nov 28, 1995] Date: 06/11/2022    Medical & Medication History    26 y.o. year old White or Caucasian female patient seen here as a referral on 11/23/2021 from [facility] for a Sleep evaluation. Chief concern according to patient : Rm , alone. Pt referred for decreased quality of sleep, feeling exhausted in the mornings after sleep, mild headaches in the morning, waking up nauseated, and since COVID having vivid dreams every night. Vivid dreams became daily after having COVID 2 years ago. Fatigued more than sleepiness. Patient is aware of talking and moving some at night. Sleeps on her side and has to constantly turn sides to avoid neck pn. Has been having leg cramps at night usually w bad dreams. Pt unsure if she snores. She lives with her father, mother  died years ago. Lauren Park is here to be evaluated for a possible sleep disorder. She has recently seen a Teacher, music for anxiety. 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  Ortho-Cyclen   Sleep Disorder      Comments   The patient came into the lab for a PSG/MSLT. No medications to stop for MSLT. The patient had no restroom trips. EKG kept in NSR. No snoring observed. All sleep stages witnessed. Respiratory events scored with a 3% desat. Slept mostly supine. The patient had frequent spontaneous arousals.     Lights out: 08:42:20 PM Lights on: 06:30:08 AM   Time Total Supine Side Prone Upright  Recording (TRT) 9h 47.26m9h 34.539mh 13.77m57m 0.77m 38m0.77m  83mep (TST) 9h 5.378m 9h26m77m 0h 37mm 0h 054m 0h 0.41m  Late44m N1 N2 N3 REM Onset Per. Slp. Eff.  Actual 0h 0.77m 0h 9.77m677m 54.77m41m 23.378m 32m18.77m 088m4.378m 9222m%   Stg51mr Wake N1 N2 N3 REM  Total 42.0 15.0 410.0 26.5 94.0  Supine 29.5 14.5 410.0 26.5 94.0  Side 12.5 0.5 0.0 0.0 0.0  Prone 0.0 0.0 0.0 0.0 0.0  Upright 0.0 0.0 0.0 0.0 0.0   Stg % Wake N1 N2 N3 REM  Total 7.1 2.7 75.2 4.9 17.2  Supine 5.0 2.7 75.2 4.9 17.2  Side 2.1 0.1 0.0 0.0 0.0  Prone 0.0 0.0 0.0 0.0 0.0  Upright 0.0 0.0 0.0 0.0 0.0     Apnea Summary Sub Supine Side Prone Upright  Total 0 Total 0 0 0 0 0    REM 0 0 0 0 0    NREM 0 0 0 0 0  Obs 0 REM 0 0 0 0 0    NREM 0 0 0 0 0  Mix 0 REM 0 0 0 0 0    NREM 0 0 0 0 0  Cen 0 REM 0 0 0 0 0    NREM 0 0 0 0 0   Rera Summary Sub Supine Side Prone Upright  Total 0 Total 0 0 0 0 0    REM 0 0 0 0 0    NREM 0 0 0 0 0   Hypopnea Summary Sub Supine Side Prone Upright  Total 8 Total _0 0 0    REM  1 1 0 0 0    NREM _0 0 0   4% Hypopnea Summary Sub Supine Side Prone Upright  Total (4%) 4 Total _1 0 0    REM 1 1 0 0 0    NREM _2 0 0     AHI Total Obs Mix Cen  0.88 Apnea 0.00 0.00 0.00 0.00   Hypopnea 0.88 -- -- --   0.44 Hypopnea (4%) 0.44 -- -- --    Total Supine Side Prone Upright  Position AHI 0.88 0.77 120.00 0.00 0.00  REM AHI 0.64   NREM AHI 0.93   Position RDI 0.88 0.77 120.00 0.00 0.00  REM RDI 0.64   NREM RDI 0.93    4% Hypopnea Total Supine Side Prone Upright  Position AHI (4%) 0.44 0.33 120.00 0.00 0.00  REM AHI (4%) 0.64   NREM AHI (4%) 0.40   Position RDI (4%) 0.44 0.33 120.00 0.00 0.00  REM RDI (4%) 0.64   NREM RDI (4%) 0.40    Desaturation Information Threshold: 2% <100% <90% <80% <70% <60% <50% <40%  Supine 86.0 0.0 0.0 0.0 0.0 0.0 0.0  Side 13.0 0.0 0.0 0.0 0.0 0.0 0.0  Prone 0.0 0.0 0.0 0.0 0.0 0.0 0.0  Upright 0.0 0.0 0.0 0.0 0.0 0.0 0.0  Total 99.0 0.0 0.0 0.0 0.0 0.0 0.0  Index 10.4 0.0 0.0 0.0 0.0 0.0 0.0   Threshold: 3% <100% <90% <80% <70% <60% <50% <40%  Supine 8.0 0.0 0.0 0.0 0.0 0.0 0.0  Side 5.0 0.0 0.0 0.0 0.0 0.0 0.0  Prone 0.0 0.0 0.0 0.0 0.0 0.0 0.0  Upright 0.0 0.0 0.0 0.0 0.0 0.0 0.0  Total 13.0 0.0 0.0 0.0 0.0 0.0 0.0  Index 1.4 0.0 0.0 0.0 0.0 0.0 0.0   Threshold: 4% <100% <90% <80% <70% <60% <50% <40%  Supine 4.0 0.0 0.0 0.0 0.0 0.0 0.0  Side 2.0 0.0 0.0 0.0 0.0 0.0 0.0  Prone 0.0 0.0 0.0 0.0 0.0 0.0 0.0  Upright 0.0 0.0 0.0 0.0 0.0 0.0 0.0  Total 6.0 0.0 0.0 0.0 0.0 0.0 0.0  Index 0.6 0.0 0.0 0.0 0.0 0.0 0.0   Threshold: 3% <100% <90% <80% <70% <60% <50% <40%  Supine 8 0 0 0 0 0 0  Side 5 0 0 0 0 0 0  Prone 0 0 0 0 0 0 0  Upright 0 0 0 0 0 0 0  Total 13 0 0 0 0 0 0   Awakening/Arousal Information # of Awakenings 27  Wake after sleep onset 24.58m Wake after persistent sleep 19.544m Arousal Assoc. Arousals Index  Apneas 0 0.0  Hypopneas 6 0.7  Leg Movements 0 0.0  Snore 0 0.0  PTT Arousals 0 0.0  Spontaneous 75 8.2  Total 81 8.9  Leg Movement Information PLMS LMs Index  Total LMs during PLMS 0 0.0  LMs w/ Microarousals 0 0.0   LM LMs Index  w/ Microarousal 0 0.0  w/ Awakening 0 0.0  w/ Resp Event 0 0.0  Spontaneous 0 0.0   Total 0 0.0     Desaturation threshold setting: 3% Minimum desaturation setting: 10 seconds SaO2 nadir: 66% The longest event was a 66 sec obstructive Hypopnea with a minimum SaO2 of 93%. The lowest SaO2 was 90% associated with a 33 sec obstructive Hypopnea. EKG Rates EKG Avg Max Min  Awake 73 92 52  Asleep 66 95 50  EKG Events:  Tachycardia  MSLT Technical Report:   General Information  Name: Lauren Park, Lauren Park BMI: 72 Physician: ,   ID: [id number] Height: 33 in Technician: Richard Miu  Sex: Female Weight: 270 lb Record: [id number]  Age: 56 [1996/06/21] Date: 06/12/2022 Scorer: Meagan Allen   Nap 1 Nap Start: 08:19:34 AM Nap End: 08:43:16 AM    Latency to Sleep Onset: 8.43mTotal Sleep Time: 15.041mtg Dur REM N1 N2 N3   0.12m53m19m 56m19m 071m   29mp 2 Nap Start: 10:16:35 AM Nap End: 10:40:14 AM    Latency to Sleep Onset: 9.19m Tot112mSleep Time: 12.19m Stg 79m REM N1 N2 N3   0.12m 2.19m 819m12m 76mm   68mp 353mp Start: 12:08:36 PM Nap End: 12:29:14 PM    Latency to Sleep Onset: 6.19m Total Sle12mTime: 14.19m Stg Dur RE719m1 N2 N3   0.12m 1.19m 13.12m 719mm  81map 442mp S312mt: 02:11:23 PM Nap End: 02:30:04 PM    Latency to Sleep Onset: 4.19m Total Sleep Tim419m14.12m Stg Dur REM N1 N319m3   0.12m 1.19m 12.19m 0.12m  64map 73map S49mt: 3m10:02 PM Nap End: 04:34:06 PM    Latency to Sleep Onset: 9.19m Total Sleep Time: 14.52mStg Dur REM N1 N2 N3  56m12m 1.12m 13.19m 0.12m    Mean18meep62mtenc89m7.7519mumber of Sleep Onset REM P21mods: 0

## 2022-06-26 ENCOUNTER — Telehealth: Payer: Self-pay | Admitting: Neurology

## 2022-06-26 NOTE — Telephone Encounter (Signed)
This patient saw Dr. Vickey Park for sleep evaluation on 11/23/2021.  I read the PSG and next day MSLT from 06/11/2022 and 06/12/2022, respectively, on Dr. Oliva Park behalf: Please call patient and advise her that her sleep study at night did not show any significant obstructive sleep apnea.  She had sleepiness during the day as demonstrated during the nap test by a mean sleep latency of 7.7 minutes, no REM sleep was achieved during the nap study.  Findings are not supportive of narcolepsy but supportive of what we would call idiopathic hypersomnolence.  She can follow-up with Dr. Vickey Park to discuss further and discuss symptomatic treatment options.  Please remind patient not to drive or use heavy machinery when she feels sleepy.

## 2022-07-04 ENCOUNTER — Telehealth: Payer: Self-pay | Admitting: Nurse Practitioner

## 2022-07-04 ENCOUNTER — Other Ambulatory Visit: Payer: Self-pay

## 2022-07-04 DIAGNOSIS — N921 Excessive and frequent menstruation with irregular cycle: Secondary | ICD-10-CM

## 2022-07-04 MED ORDER — NORGESTIMATE-ETH ESTRADIOL 0.25-35 MG-MCG PO TABS: 1.0000 | ORAL_TABLET | Freq: Every day | ORAL | 0 refills | Status: DC

## 2022-07-04 NOTE — Telephone Encounter (Signed)
Prescription Request  07/04/2022  Is this a "Controlled Substance" medicine? No  LOV: Visit date not found  What is the name of the medication or equipment? norgestimate-ethinyl estradiol (ORTHO-CYCLEN, 28,) 0.25-35 MG-MCG tablet   Have you contacted your pharmacy to request a refill? Yes   Which pharmacy would you like this sent to?  [facility] [address] [place] - [address] [place] [address] Phone: [phone] Fax: [phone]    Patient notified that their request is being sent to the clinical staff for review and that they should receive a response within 2 business days.   Please advise at [facility] [phone]

## 2022-07-12 ENCOUNTER — Other Ambulatory Visit: Payer: Self-pay

## 2022-07-12 DIAGNOSIS — N921 Excessive and frequent menstruation with irregular cycle: Secondary | ICD-10-CM

## 2022-07-12 MED ORDER — NORGESTIMATE-ETH ESTRADIOL 0.25-35 MG-MCG PO TABS: 1.0000 | ORAL_TABLET | Freq: Every day | ORAL | 0 refills | Status: DC

## 2022-08-11 ENCOUNTER — Other Ambulatory Visit: Payer: Self-pay | Admitting: Family Medicine

## 2022-08-11 DIAGNOSIS — N921 Excessive and frequent menstruation with irregular cycle: Secondary | ICD-10-CM

## 2022-09-14 ENCOUNTER — Ambulatory Visit (INDEPENDENT_AMBULATORY_CARE_PROVIDER_SITE_OTHER): Payer: PRIVATE HEALTH INSURANCE | Admitting: Neurology

## 2022-09-14 ENCOUNTER — Encounter: Payer: Self-pay | Admitting: Neurology

## 2022-09-14 VITALS — BP 134/79 | HR 71 | Ht 68.0 in | Wt 207.0 lb

## 2022-09-14 DIAGNOSIS — G4711 Idiopathic hypersomnia with long sleep time: Secondary | ICD-10-CM | POA: Diagnosis not present

## 2022-09-14 DIAGNOSIS — F518 Other sleep disorders not due to a substance or known physiological condition: Secondary | ICD-10-CM

## 2022-09-14 MED ORDER — ARMODAFINIL 250 MG PO TABS: ORAL_TABLET | ORAL | 5 refills | Status: DC

## 2022-09-14 NOTE — Patient Instructions (Signed)
Narcolepsy Narcolepsy is a disorder that causes people to fall asleep suddenly and without control (have sleep attacks) during the daytime. It is a lifelong disorder. Narcolepsy disrupts the sleep cycle at night, which then causes daytime sleepiness. What are the causes? The cause of narcolepsy is not fully understood, but it may be related to: Low levels of hypocretin, a chemical (neurotransmitter) in the brain that controls sleep and wake cycles. Hypocretin imbalance may be caused by: Abnormal genes that are passed from parent to child (inherited). An autoimmune disease in which the body's defense system (immune system) attacks the brain cells that make hypocretin. Infection, tumor, or injury in the area of the brain that controls sleep. Exposure to poisons (toxins), such as heavy metals, pesticides, and secondhand smoke. What are the signs or symptoms? Symptoms of this condition include: Excessive daytime sleepiness. This is the most common symptom and is usually the first symptom you will notice. This may affect your performance at work or school. Sleep attacks. You may fall asleep in the middle of an activity, especially low-energy activities like reading or watching TV. Feeling like you cannot think clearly and having trouble focusing or remembering things. You may also feel depressed. Sudden muscle weakness (cataplexy). When this occurs, your speech may become slurred, or your knees may buckle. Cataplexy is usually triggered by surprise, anger, fear, or laughter. Losing the ability to speak or move (sleep paralysis). This may occur just as you start to fall asleep or wake up. You will be aware of the paralysis. It usually lasts for just a few seconds or minutes. Seeing, hearing, tasting, smelling, or feeling things that are not real (hallucinations). Hallucinations may occur with sleep paralysis. They can happen when you are falling asleep, waking up, or dozing. Trouble staying asleep at  night (insomnia) and restless sleep. How is this diagnosed? This condition may be diagnosed based on: A physical exam to rule out any other problems that may be causing your symptoms. You may be asked to write down your sleeping patterns for several weeks in a sleep diary. This will help your health care provider make a diagnosis. Sleep studies that measure how well your REM sleep is regulated. These tests also measure your heart rate, breathing, movement, and brain waves. These tests include: An overnight sleep study (polysomnogram). A daytime sleep study that is done while you take several naps during the day (multiple sleep latency test, MSLT). This test measures how quickly you fall asleep and how quickly you enter REM sleep. Removal of spinal fluid to measure hypocretin levels. How is this treated? There is no cure for this condition, but treatment can help relieve symptoms. Treatment may include: Lifestyle and sleeping strategies to help you cope with the condition, such as: Exercising regularly. Maintaining a regular sleep schedule. Avoiding caffeine and large meals before bed. Medicines. These may include: Medicines that help keep you awake and alert (stimulants) to fight daytime sleepiness. Medicines that treat depression (antidepressants). These may be used to treat cataplexy. Sodium oxybate. This is a strong medicine to help you relax (sedative) that you may take at night. It can help control daytime sleepiness and cataplexy. Other treatments may include mental health counseling or joining a support group. Follow these instructions at home: Sleeping habits  Get about 8 hours of sleep every night. Go to sleep and get up at about the same time every day. Keep your bedroom dark, quiet, and comfortable. When you feel very tired, take short naps. Schedule naps  so that you take them at about the same time every day. Before bedtime: Avoid bright lights and screens. Relax. Try  activities like reading or taking a warm bath. Activity Get at least 20 minutes of exercise every day. This will help you sleep better at night and reduce daytime sleepiness. Avoid exercising within 3 hours of bedtime. Do not drive or use machinery if you are sleepy. If possible, take a nap before driving. Do not swim or go out on the water without a life jacket. Eating and drinking Do not drink alcohol or caffeinated beverages within 4-5 hours of bedtime. Do not eat a large meal before bedtime. Eat meals at about the same times every day. General instructions  Take over-the-counter and prescription medicines only as told by your health care provider. Keep a sleep diary as told by your health care provider. Tell your employer or teachers that you have narcolepsy. You may be able to adjust your schedule to include time for naps. Do not use any products that contain nicotine or tobacco. These products include cigarettes, chewing tobacco, and vaping devices, such as e-cigarettes. If you need help quitting, ask your health care provider. Where to find more information [employer] of Neurological Disorders and Stroke: [email or web address] Contact a health care provider if: Your symptoms are not getting better. You have fast or irregular heartbeats (palpitations). You are having a hard time determining what is real and what is not (psychosis). Get help right away if: You hurt yourself during a sleep attack or an attack of cataplexy. You have chest pain. You have trouble breathing. These symptoms may be an emergency. Get help right away. Call 911. Do not wait to see if the symptoms will go away. Do not drive yourself to the hospital. Summary Narcolepsy is a disorder that causes people to fall asleep suddenly and without control during the daytime (sleep attacks). Narcolepsy is a lifelong disorder with no cure. Treatment can help relieve symptoms. Go to sleep and get up at about the same time  every day. Follow instructions about sleep and activities as told by your health care provider. Take over-the-counter and prescription medicines only as told by your health care provider. This information is not intended to replace advice given to you by your health care provider. Make sure you discuss any questions you have with your health care provider. Document Revised: 10/28/2021 Document Reviewed: 10/28/2021 Elsevier Patient Education  [place]. Hypersomnia Hypersomnia is a condition in which a person feels very tired during the day even though the person gets plenty of sleep at night. A person with this condition may take naps during the day and may find it very difficult to wake up from sleep. Hypersomnia may affect a person's ability to think, concentrate, drive, or remember things. What are the causes? The cause of this condition may not be known. Possible causes include: Taking certain medicines. Using drugs or alcohol. Sleep disorders, such as narcolepsy and sleep apnea. Injury to the head, brain, or spinal cord. Tumors. Certain medical conditions. These include: Depression. Diabetes. Gastroesophageal reflux disease (GERD). An underactive thyroid gland (hypothyroidism). What are the signs or symptoms? The main symptoms of hypersomnia include: Feeling very tired throughout the day, regardless of how much sleep you got the night before. Having trouble waking up. Others may find it difficult to wake you up when you are sleeping. Sleeping for longer and longer periods at a time. Taking naps throughout the day. Other symptoms may include: Feeling restless,  anxious, or annoyed. Lacking energy. Having trouble with: Remembering. Speaking. Thinking. Loss of appetite. Seeing, hearing, tasting, smelling, or feeling things that are not real (hallucinations). How is this diagnosed? This condition may be diagnosed based on: Your symptoms and medical history. Your  sleeping habits. Your health care provider may ask you to write down your sleeping habits in a daily sleep log, along with any symptoms you have. A series of tests that are done while you sleep (sleep study or polysomnogram). A test that measures how quickly you can fall asleep during the day (daytime nap study or multiple sleep latency test). How is this treated? This condition may be treated by: Following a regular sleep routine. Making lifestyle changes, such as changing your eating habits, getting regular exercise, and avoiding alcohol or caffeinated beverages. Taking medicines to make you more alert (stimulants) during the day. Treating any underlying medical causes of hypersomnia. Follow these instructions at home: Sleep habits Stick to a routine that includes going to bed and waking up at the same times every day and night. Practice a relaxing bedtime routine. This may include reading, meditation, deep breathing, or taking a warm bath before going to sleep. Exercise regularly as told by your health care provider. However, avoid exercising in the hours right before bedtime. Keep your sleep environment at a cooler temperature, darkened, and quiet. Sleep with pillows and a mattress that are comfortable and supportive. Schedule short 20-minute naps for when you feel sleepiest during the day. Talk with your employer or teachers about your hypersomnia. If possible, adjust your schedule so that: You have a regular daytime work schedule. You can take a scheduled nap during the day. You do not have to work or be active at night. Do not eat a heavy meal for a few hours before bedtime. Eat your meals at about the same times every day. Safety  Do not drive or use machinery if you are sleepy. Ask your health care provider if it is safe for you to drive. Wear a life jacket when swimming or spending time near water. General instructions  Take over-the-counter and prescription medicines only as  told by your health care provider. This includes supplements. Avoid drinking alcohol or caffeinated beverages. Keep a sleep log that will help your health care provider manage your condition. This may include information about: What time you go to bed each night. How often you wake up at night. How many hours you sleep at night. How often and for how long you nap during the day. Any observations from others, such as leg movements during sleep, sleep walking, or snoring. Keep all follow-up visits. This is important. Contact a health care provider if: You have new symptoms. Your symptoms get worse. Get help right away if: You have thoughts about hurting yourself or someone else. Get help right away if you feel like you may hurt yourself or others, or have thoughts about taking your own life. Go to your nearest emergency room or: Call 911. Call the [place] at 7264767032 or 988. This is open 24 hours a day. Text the Crisis Text Line at 838-332-4886. Summary Hypersomnia refers to a condition in which you feel very tired during the day even though you get plenty of sleep at night. A person with this condition may take naps during the day and may find it very difficult to wake up from sleep. Hypersomnia may affect a person's ability to think, concentrate, drive, or remember things. Treatment may  include a regular sleep routine and making some lifestyle changes. This information is not intended to replace advice given to you by your health care provider. Make sure you discuss any questions you have with your health care provider. Document Revised: 06/06/2021 Document Reviewed: 06/06/2021 Elsevier Patient Education  [place].

## 2022-09-14 NOTE — Progress Notes (Signed)
Provider:  Larey Seat, MD  Primary Care Physician:  Amed[facility]ite, Trenton Gammon, FNP (Inactive) No address on file     Referring Provider: Thersa Salt, MD [facility].        Chief Complaint according to patient   Patient presents with:     Sleep Patient (ReVisit)     PSG and MSLT -       HISTORY OF PRESENT ILLNESS:  Lauren Park is a 27 y.o. female patient who is here for revisit 09/14/2022 for  follow up after PSG and MSLT test in 06-11-2022, interpreted by Dr Rexene Alberts on my behalf.  She has reported  post Covid onset of vivid dreams, violent dreams , hypersomnia and some fatigue.  After her first encounter with me I drew an HLA narcolepsy panel to see if she may have a trait for narcolepsy given that narcolepsy as an autoimmune disorder can be triggered by a preceding viral infection as she has described.  1 allele returned positive. The [place]lick from sonography study showed no clinical significant degree of apnea there were no apneas there was only for shallow breathing spells and this led to an apnea hypopnea index of 0.4/h.  We consider AHI is up to 5/h physiologically normal.  Nadir of oxygen was 88%.  There were no periodic limb movements.  There were 8 spontaneous arousals.  She had a normal sinus rhythm on EKG and she had a normal EEG.  This study was followed by an MSLT the next day which was sleep 12 June 2022 and reached a mean sleep latency of 7.7 minutes for 5 naps but she did not have any REM onset.  I would like to add that her REM sleep latency during her PSG was also over 80 minutes.  We meet today to make sure that we treat the at this time considered idiopathic origin of her hypersomnia and the medication that can be used is Provigil or Nuvigil I would like to start on armodafinil the generic form of Nuvigil at 150 mg daily.  This dosage is in the tablet form and she could even break a tablet in half if she feels too jittery or feels that she has  headaches or other side effects unwanted.  If 150 mg would be considered not enough to keep her alert and awake during the day I can still increase the dose to 250 mg.  A common phenomena is that the effect of the medication lasts about 7 to 8 hours on average so for some of our patients that is not enough to drive safely home after a day of work and in those cases we will ask to break the Nuvigil  tablet in half take the first dose in the morning take a second dose 2 to 3 hours later so that an added time of alertness can be expected.  Nuvigil should not be taken within 9 hours of intended bedtime.      HISTORY OF PRESENT ILLNESS:  Lauren Park is a 27 y.o. year old White or Caucasian female patient seen here as a referral on 11/23/2021 from NP Ameduite for a Sleep evaluation.  Chief concern according to patient :  Rm , alone. Pt referred for decreased quality of sleep, feeling exhausted in the mornings after sleep, mild headaches in the morning, waking up nauseated, and since COVID having vivid dreams every night.  Vivid dreams became daily after having COVID 2  years ago. Fatigued more than sleepiness.   Patient is aware of talking and moving some at night. Sleeps on her side and has to constantly turn sides to avoid neck pn. Has been having leg cramps at night usually w bad dreams. Pt unsure if she snores. She lives with her father, mother died years ago.   Lauren Park  is here to be evaluated for a possible sleep disorder.  She has recently seen a Teacher, music for anxiety.  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   Sleep relevant medical history: Vivid dreams, falling asleep easily, but dreams interrupt sleep, dreams are starting as soon as she is drowsy,  in installments, but not lucid dreaming.     Family medical /sleep history: Father with OSA, 2 younger siblings have no sleep issues. Brother is a lucid dreamer     Social history:  Patient is working as a Forensic scientist and lives in a household with step father  . The patient currently works day shift. Pets : one dog and one cat . Tobacco use- none .  ETOH use- none ,  Caffeine intake in form of Coffee( /) Soda( 5/ week) Tea ( /) no energy drinks. Regular exercise - none.       Sleep habits are as follows: The patient's dinner time is between 6.30- 8 PM.  The patient goes to bed at 10.30-11 PM and continues to sleep for 6-8 hours. The preferred sleep position is variable , with the support of 1-2 pillows. Dreams are reportedly frequent/vivid- every night .  6.30  AM is the usual rise time. She reports not feeling refreshed or restored in AM, with symptoms such morning headaches, and residual fatigue.  Naps are taken frequently, during lunch breaks, 20-30 minutes- refreshing  for 1-2 hours.     Review of Systems: Out of a complete 14 system review, the patient complains of only the following symptoms, and all other reviewed systems are negative.:  Fatigue, sleepiness   How likely are you to doze in the following situations: 0 = not likely, 1 = slight chance, 2 = moderate chance, 3 = high chance   Sitting and Reading? Watching Television? Sitting inactive in a public place (theater or meeting)? As a passenger in a car for an hour without a break? Lying down in the afternoon when circumstances permit? Sitting and talking to someone? Sitting quietly after lunch without alcohol? In a car, while stopped for a few minutes in traffic?   Total = 12-/ 24 points   FSS endorsed at 33/ 63 points.   Daily naps , Lunchtime 20-35 minutes. Not more refreshed.   Social History   Socioeconomic History   Marital status: Single    Spouse name: Not on file   Number of children: Not on file   Years of education: Not on file   Highest education level: Bachelor's degree (e.g., BA, AB, BS)  Occupational History   Not on file  Tobacco Use   Smoking  status: Never   Smokeless tobacco: Never  Vaping Use   Vaping Use: Never used  Substance and Sexual Activity   Alcohol use: No   Drug use: No   Sexual activity: Not Currently    Birth control/protection: Implant, Pill  Other Topics Concern   Not on file  Social History Narrative   Lives with father   R handed   Caffeine: 4-5 16oz cans a  week.    Social Determinants of Health   Financial Resource Strain: Not on file  Food Insecurity: Not on file  Transportation Needs: Not on file  Physical Activity: Not on file  Stress: Not on file  Social Connections: Not on file    Family History  Problem Relation Age of Onset   High blood pressure Mother    Heart attack Mother    Depression Mother    Uterine cancer Mother    Breast cancer Maternal Uncle    Stroke Maternal Grandmother    Heart attack Maternal Grandmother    Depression Maternal Grandmother    High blood pressure Maternal Grandmother    Depression Maternal Grandfather    High blood pressure Maternal Grandfather    Brain cancer Maternal Grandfather     Past Medical History:  Diagnosis Date   Anterior subluxation of humerus 06/2012   left   Depression    no current med.   Migraines    Shoulder dislocation 06/2012   left    Past Surgical History:  Procedure Laterality Date   CLOSED REDUCTION SHOULDER DISLOCATION     x 2 - under conscious sedation   SHOULDER ARTHROSCOPY WITH BANKART REPAIR  07/05/2012   Procedure: SHOULDER ARTHROSCOPY WITH BANKART REPAIR;  Surgeon: Johnny Bridge, MD;  Location: [facility];  Service: Orthopedics;  Laterality: Left;  LEFT SHOULDER  ARTHROSCOPY CAPSULORRHAPHY     Current Outpatient Medications on File Prior to Visit  Medication Sig Dispense Refill   SPRINTEC 28 0.25-35 MG-MCG tablet Take 1 tablet by mouth once daily 28 tablet 2   No current facility-administered medications on file prior to visit.    Allergies  Allergen Reactions   Insect Extract Swelling     BEES, MOSQUITOES   Poison Sumac Extract Rash     DIAGNOSTIC DATA (LABS, IMAGING, TESTING) - I reviewed patient records, labs, notes, testing and imaging myself where available.  Lab Results  Component Value Date   WBC 9.1 08/03/2021   HGB 13.3 08/03/2021   HCT 40.7 08/03/2021   MCV 75 (L) 08/03/2021   PLT 368 08/03/2021      Component Value Date/Time   NA 137 08/03/2021 1515   K 3.7 08/03/2021 1515   CL 102 08/03/2021 1515   CO2 21 08/03/2021 1515   GLUCOSE 76 08/03/2021 1515   GLUCOSE 107 (H) 03/24/2011 2359   BUN 13 08/03/2021 1515   CREATININE 0.77 08/03/2021 1515   CALCIUM 9.8 08/03/2021 1515   PROT 7.8 08/03/2021 1515   ALBUMIN 4.8 08/03/2021 1515   AST 15 08/03/2021 1515   ALT 11 08/03/2021 1515   ALKPHOS 93 08/03/2021 1515   BILITOT 0.3 08/03/2021 1515   GFRNONAA 124 03/30/2016 0923   GFRAA 143 03/30/2016 0923   Lab Results  Component Value Date   CHOL 155 08/03/2021   HDL 37 (L) 08/03/2021   LDLCALC 97 08/03/2021   TRIG 117 08/03/2021   CHOLHDL 4.2 08/03/2021   Lab Results  Component Value Date   HGBA1C 5.2 06/14/2017   No results found for: "VITAMINB12" Lab Results  Component Value Date   TSH 2.930 03/30/2016    PHYSICAL EXAM:  Today's Vitals   09/14/22 1512  BP: 134/79  Pulse: 71  Weight: 207 lb (93.9 kg)  Height: '5\' 8"'$  (1.727 m)   Body mass index is 31.47 kg/m.   Wt Readings from Last 3 Encounters:  09/14/22 207 lb (93.9 kg)  11/23/21 270 lb (122.5 kg)  09/21/21 275 lb 3.2 oz (124.8 kg)     Ht Readings from Last 3 Encounters:  09/14/22 '5\' 8"'$  (1.727 m)  11/23/21 '5\' 8"'$  (1.727 m)  09/21/21 '5\' 8"'$  (1.727 m)      General: The patient is awake, alert and appears not in acute distress. The patient is well groomed. Head: Normocephalic, atraumatic.  Neck is supple. General: The patient is awake, alert and appears not in acute distress. The patient is well groomed. Mallampati 2, large tonsils neck circumference:16 inches . Nasal  airflow congested, voice is affected    Retrognathia is not seen.  Dental status: biological  Cardiovascular:  Regular rate and cardiac rhythm by pulse,  without distended neck veins. Respiratory: Lungs are clear to auscultation.  Skin:  Without evidence of ankle edema, or rash. She is neither pale nor diaphoretic  Trunk: The patient's posture is erect.   Neurologic exam : The patient is awake and alert, oriented to place and time.   Memory subjective described as intact.  Attention span & concentration ability appears normal.  Speech is fluent,  without dysphonia . Mood and affect are appropriate.   Cranial nerves: no loss of smell or taste reported  Pupils are equal and briskly reactive to light. Funduscopic exam deferred .  Extraocular movements in vertical and horizontal planes were intact and without nystagmus. No Diplopia. Visual fields by finger perimetry are intact. Hearing was intact to soft voice and finger rubbing.    Facial sensation intact to fine touch.  Facial motor strength is symmetric and tongue and uvula move midline.  Neck ROM : rotation, tilt and flexion extension were normal for age and shoulder shrug was symmetrical.    Motor exam:  Symmetric bulk, tone and ROM.   Normal tone , symmetric grip strength .   Sensory:  Fine touch, pinprick and vibration were tested  and  normal.  Proprioception tested in the upper extremities was normal.   Coordination: Rapid alternating movements in the fingers/hands were of normal speed.    Gait and station: Patient could rise unassisted from a seated position, walked without assistive device.   Deep tendon reflexes: in the  upper and lower extremities are symmetric and intact.  Babinski response was deferred      ASSESSMENT AND PLAN 27 y.o. 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 She has the allel by HLA test for a higher risk of  developing narcolepsy. She reports intrusive and active ,vivid dreams.   2) plan to start NUVIGIL generic form at 250 mg daily. Can be increased if limited effect is noted. Plan B would be Vyvanse, Adderall , Ritalin.   3) Rv in 6 months with NP for refills,    I plan to follow up either personally or through our NP within 6 months.   I would like to thank [facility], Trenton Gammon, FNP (Inactive) and No referring provider defined for this encounter. for allowing me to meet with and to take care of this pleasant patient.   CC: I will share my notes with PCP.  After spending a total time of  20  minutes face to face and additional time for physical and neurologic examination, review of laboratory studies,  personal review of imaging studies, reports and results of other testing and review of referral information / records as far as provided in visit,   Electronically signed  by: Larey Seat, MD 09/14/2022 3:44 PM  [facility] Board certified by The [employer] of Sleep Medicine and Diplomate of the [employer] of Sleep Medicine. Board certified In Neurology through the [employer], Fellow of the [employer] of Neurology. Medical Director of [facility].

## 2022-09-20 ENCOUNTER — Ambulatory Visit: Payer: Self-pay | Admitting: Family Medicine

## 2022-09-21 ENCOUNTER — Ambulatory Visit: Payer: 59 | Admitting: Neurology

## 2022-09-27 ENCOUNTER — Telehealth: Payer: Self-pay | Admitting: Neurology

## 2022-09-27 NOTE — Telephone Encounter (Signed)
PA submitted via CMM Key: [id number] awaiting determination

## 2022-09-27 NOTE — Telephone Encounter (Signed)
Pt called. Stated she is following up on the PA for  Armodafinil 250 MG tablet. Pt said [facility] said they sent a request for PA to be done.

## 2022-10-09 ENCOUNTER — Encounter: Payer: Self-pay | Admitting: Neurology

## 2022-11-29 ENCOUNTER — Encounter: Payer: Self-pay | Admitting: Family Medicine

## 2022-11-29 ENCOUNTER — Ambulatory Visit (INDEPENDENT_AMBULATORY_CARE_PROVIDER_SITE_OTHER): Payer: 59 | Admitting: Family Medicine

## 2022-11-29 DIAGNOSIS — Z30011 Encounter for initial prescription of contraceptive pills: Secondary | ICD-10-CM

## 2022-11-29 DIAGNOSIS — F3281 Premenstrual dysphoric disorder: Secondary | ICD-10-CM | POA: Diagnosis not present

## 2022-11-29 DIAGNOSIS — G4711 Idiopathic hypersomnia with long sleep time: Secondary | ICD-10-CM

## 2022-11-29 MED ORDER — NORGESTIMATE-ETH ESTRADIOL 0.25-35 MG-MCG PO TABS: 1.0000 | ORAL_TABLET | Freq: Every day | ORAL | 12 refills | Status: DC

## 2022-11-29 NOTE — Assessment & Plan Note (Signed)
Patient's birth control refilled.

## 2022-11-29 NOTE — Patient Instructions (Addendum)
Call neurology and discuss. This medication can interfere with birth control.   If you continue to have issues with your mood and desire treatment, please send me a message.  Take care  Dr. Adriana Simas

## 2022-11-29 NOTE — Assessment & Plan Note (Signed)
Discussed that there are treatment options for this.   Patient to consider.

## 2022-11-29 NOTE — Assessment & Plan Note (Signed)
Patient's medication interferes with her birth control.  I advised her to discuss this with neurology.  They have mention other medications that she can use, particularly stable medications.  Patient will contact them.

## 2022-11-29 NOTE — Progress Notes (Signed)
Subjective:  Patient ID: Lauren Park, female    DOB: 1996/06/04  Age: 27 y.o. 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  Patient states that the Ronne Binning has helped but she is still feeling quite exhausted.  She would like to discuss this with me today.  I have advised her that there are other treatment options.  I have advised her to reach out to neurology.  Patient states that she needs a refill on her birth control medication.  Patient also reports that she has been having issues with her mood and feeling emotionally unregulated around the time of her menstrual cycle.  She states that it has been worse as of late.  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    Social Hx   Social History   Socioeconomic History   Marital status: Single    Spouse name: Not on file   Number of children: Not on file   Years of education: Not on file   Highest education level: Bachelor's degree (e.g., BA, AB, BS)  Occupational History   Not on file  Tobacco Use   Smoking status: Never   Smokeless tobacco: Never  Vaping Use   Vaping Use: Never used  Substance and Sexual Activity   Alcohol use: No   Drug use: No   Sexual activity: Not Currently    Birth control/protection: Implant, Pill  Other Topics Concern   Not on file  Social History Narrative   Lives with father   R handed   Caffeine: 4-5 16oz cans a week.    Social Determinants of Health   Financial Resource Strain: Not on file  Food Insecurity: Not on file  Transportation Needs: Not on file  Physical Activity: Not on file  Stress: Not on file  Social Connections: Not on file    Review  of Systems Per HPI  Objective:  BP 111/76   Pulse 89   Temp 98.2 F (36.8 C)   Ht 5\' 8"  (1.727 m)   Wt 201 lb (91.2 kg)   LMP 11/17/2022   SpO2 97%   BMI 30.56 kg/m      11/29/2022    9:28 AM 09/14/2022    3:12 PM 11/23/2021    2:58 PM  BP/Weight  Systolic BP 111 134 131  Diastolic BP 76 79 75  Wt. (Lbs) 201 207 270  BMI 30.56 kg/m2 31.47 kg/m2 41.05 kg/m2    Physical Exam Vitals and nursing note reviewed.  Constitutional:      General: She is not in acute distress.    Appearance: Normal appearance.  HENT:     Head: Normocephalic and atraumatic.  Cardiovascular:     Rate and Rhythm: Normal rate and regular rhythm.  Pulmonary:     Effort: Pulmonary effort is normal.     Breath sounds: Normal breath sounds. No wheezing, rhonchi or rales.  Neurological:     Mental Status: She is alert.  Psychiatric:        Mood and Affect: Mood normal.        Behavior: Behavior normal.     Lab Results  Component Value Date   WBC 9.1 08/03/2021  HGB 13.3 08/03/2021   HCT 40.7 08/03/2021   PLT 368 08/03/2021   GLUCOSE 76 08/03/2021   CHOL 155 08/03/2021   TRIG 117 08/03/2021   HDL 37 (L) 08/03/2021   LDLCALC 97 08/03/2021   ALT 11 08/03/2021   AST 15 08/03/2021   NA 137 08/03/2021   K 3.7 08/03/2021   CL 102 08/03/2021   CREATININE 0.77 08/03/2021   BUN 13 08/03/2021   CO2 21 08/03/2021   TSH 2.930 03/30/2016   HGBA1C 5.2 06/14/2017     Assessment & Plan:   Problem List Items Addressed This Visit       Other   Idiopathic hypersomnia    Patient's medication interferes with her birth control.  I advised her to discuss this with neurology.  They have mention other medications that she can use, particularly stable medications.  Patient will contact them.      OCP (oral contraceptive pills) initiation    Patient's birth control refilled.      Relevant Medications   norgestimate-ethinyl estradiol (SPRINTEC 28) 0.25-35 MG-MCG tablet   PMDD (premenstrual  dysphoric disorder)    Discussed that there are treatment options for this.   Patient to consider.       Meds ordered this encounter  Medications   norgestimate-ethinyl estradiol (SPRINTEC 28) 0.25-35 MG-MCG tablet    Sig: Take 1 tablet by mouth daily.    Dispense:  28 tablet    Refill:  12    Shevelle Smither DO [facility]

## 2022-12-12 ENCOUNTER — Encounter: Payer: Self-pay | Admitting: Adult Health

## 2022-12-12 ENCOUNTER — Encounter: Payer: Self-pay | Admitting: Family Medicine

## 2022-12-13 ENCOUNTER — Other Ambulatory Visit: Payer: Self-pay | Admitting: Family Medicine

## 2023-01-30 MED ORDER — AMPHETAMINE-DEXTROAMPHETAMINE 10 MG PO TABS: 10.0000 mg | ORAL_TABLET | Freq: Every day | ORAL | 0 refills | Status: DC

## 2023-01-30 NOTE — Addendum Note (Signed)
Addended by: Judi Cong on: 01/30/2023 10:57 AM   Modules accepted: Orders

## 2023-02-09 ENCOUNTER — Encounter: Payer: Self-pay | Admitting: Family Medicine

## 2023-02-09 ENCOUNTER — Ambulatory Visit (INDEPENDENT_AMBULATORY_CARE_PROVIDER_SITE_OTHER): Payer: 59 | Admitting: Family Medicine

## 2023-02-09 VITALS — BP 111/73 | HR 72 | Temp 98.1°F | Ht 68.0 in | Wt 208.0 lb

## 2023-02-09 DIAGNOSIS — F419 Anxiety disorder, unspecified: Secondary | ICD-10-CM

## 2023-02-09 DIAGNOSIS — F32A Depression, unspecified: Secondary | ICD-10-CM

## 2023-02-09 MED ORDER — FLUOXETINE HCL 10 MG PO TABS: 10.0000 mg | ORAL_TABLET | Freq: Every day | ORAL | 0 refills | Status: DC

## 2023-02-09 NOTE — Patient Instructions (Signed)
Medication as prescribed.  Follow up in 6 weeks. 

## 2023-02-11 DIAGNOSIS — F419 Anxiety disorder, unspecified: Secondary | ICD-10-CM | POA: Insufficient documentation

## 2023-02-11 NOTE — Assessment & Plan Note (Signed)
Severe.  Uncontrolled.  Information given regarding [facility].  Starting on fluoxetine.

## 2023-02-11 NOTE — Progress Notes (Signed)
Subjective:  Patient ID: Lauren Park, female    DOB: [birth year]  Age: 27 y.o. MRN: [id number]  CC: Chief Complaint  Patient presents with   hypersomnolence follow up    Modafinil stopped by neurologist due to it causing depression, was started on adderall and this doesn't help - patient experiencing depression / anxiety     HPI:  26 year old female presents for evaluation of the above.  Patient continues to follow with neurology regarding hypersomnolence.  Armodafinil has been discontinued.  She is currently on Adderall.  Patient reports that she is suffering from severe depression and anxiety.  PHQ-9 score of 20.  GAD-7 score of 21.  Patient reports that a lot of her anxiety is due to the fact that she is worried that her significant other will leave.  She also seems to have some social anxiety.  She is interested in counseling.  Will discuss pharmacotherapy today as well.  Of note, patient states that she has previously been on Lexapro.  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    Social Hx   Social History   Socioeconomic History   Marital status: Single    Spouse name: Not on file   Number of children: Not on file   Years of education: Not on file   Highest education level: Bachelor's degree (e.g., BA, AB, BS)  Occupational History   Not on file  Tobacco Use   Smoking status: Never   Smokeless tobacco: Never  Vaping Use   Vaping status: Never Used  Substance and Sexual Activity   Alcohol use: No   Drug use: No   Sexual activity: Not Currently    Birth control/protection: Implant, Pill  Other Topics Concern   Not on file  Social History Narrative   Lives with father   R handed   Caffeine: 4-5 16oz cans a week.     Social Determinants of Health   Financial Resource Strain: Not on file  Food Insecurity: Not on file  Transportation Needs: Not on file  Physical Activity: Not on file  Stress: Not on file  Social Connections: Not on file    Review of Systems Per HPI  Objective:  BP 111/73   Pulse 72   Temp 98.1 F (36.7 C)   Ht 5\' 8"  (1.727 m)   Wt 208 lb (94.3 kg)   LMP 01/21/2023   SpO2 100%   BMI 31.63 kg/m      02/09/2023    1:44 PM 11/29/2022    9:28 AM 09/14/2022    3:12 PM  BP/Weight  Systolic BP 111 111 134  Diastolic BP 73 76 79  Wt. (Lbs) 208 201 207  BMI 31.63 kg/m2 30.56 kg/m2 31.47 kg/m2    Physical Exam Vitals and nursing note reviewed.  Constitutional:      General: She is not in acute distress.    Appearance: Normal appearance.  Eyes:     General:        Right eye: No discharge.        Left eye: No discharge.     Conjunctiva/sclera: Conjunctivae normal.  Pulmonary:     Effort: Pulmonary effort is normal. No respiratory distress.  Neurological:     Mental Status: She is alert.  Psychiatric:  Mood and Affect: Mood normal.        Behavior: Behavior normal.     Lab Results  Component Value Date   WBC 9.1 08/03/2021   HGB 13.3 08/03/2021   HCT 40.7 08/03/2021   PLT 368 08/03/2021   GLUCOSE 76 08/03/2021   CHOL 155 08/03/2021   TRIG 117 08/03/2021   HDL 37 (L) 08/03/2021   LDLCALC 97 08/03/2021   ALT 11 08/03/2021   AST 15 08/03/2021   NA 137 08/03/2021   K 3.7 08/03/2021   CL 102 08/03/2021   CREATININE 0.77 08/03/2021   BUN 13 08/03/2021   CO2 21 08/03/2021   TSH 2.930 03/30/2016   HGBA1C 5.2 06/14/2017     Assessment & Plan:   Problem List Items Addressed This Visit       Other   Anxiety and depression - Primary    Severe.  Uncontrolled.  Information given regarding [facility].  Starting on fluoxetine.      Relevant Medications   FLUoxetine (PROZAC) 10 MG tablet    Meds ordered this encounter  Medications    FLUoxetine (PROZAC) 10 MG tablet    Sig: Take 1 tablet (10 mg total) by mouth daily.    Dispense:  90 tablet    Refill:  0    Follow-up:  6 weeks.   Everlene Other DO [facility]

## 2023-02-26 ENCOUNTER — Other Ambulatory Visit: Payer: Self-pay | Admitting: Neurology

## 2023-02-26 NOTE — Telephone Encounter (Signed)
Pt is requesting a refill for amphetamine-dextroamphetamine (ADDERALL) 10 MG tablet .  Pharmacy:  [facility] [phone] -

## 2023-02-27 MED ORDER — AMPHETAMINE-DEXTROAMPHETAMINE 10 MG PO TABS: 10.0000 mg | ORAL_TABLET | Freq: Every day | ORAL | 0 refills | Status: DC

## 2023-03-22 ENCOUNTER — Encounter: Payer: Self-pay | Admitting: Adult Health

## 2023-03-22 ENCOUNTER — Ambulatory Visit (INDEPENDENT_AMBULATORY_CARE_PROVIDER_SITE_OTHER): Payer: 59 | Admitting: Family Medicine

## 2023-03-22 ENCOUNTER — Encounter: Payer: Self-pay | Admitting: Family Medicine

## 2023-03-22 VITALS — BP 121/80 | HR 73 | Temp 98.4°F | Wt 206.0 lb

## 2023-03-22 DIAGNOSIS — F419 Anxiety disorder, unspecified: Secondary | ICD-10-CM

## 2023-03-22 DIAGNOSIS — M24412 Recurrent dislocation, left shoulder: Secondary | ICD-10-CM | POA: Diagnosis not present

## 2023-03-22 DIAGNOSIS — F32A Depression, unspecified: Secondary | ICD-10-CM | POA: Diagnosis not present

## 2023-03-22 MED ORDER — AMPHETAMINE-DEXTROAMPHETAMINE 10 MG PO TABS: 10.0000 mg | ORAL_TABLET | Freq: Two times a day (BID) | ORAL | 0 refills | Status: DC

## 2023-03-22 NOTE — Progress Notes (Signed)
Subjective:  Patient ID: Lauren Park, female    DOB: [birth year]  Age: 27 y.o. MRN: [id number]  CC: Chief Complaint  Patient presents with   Follow-up    6 week follow up for anxiety and depression     HPI:  27 year old female presents for follow up.  Patient has noticed and improvement on Prozac. Doing better. Has started counseling. No side effects from the Prozac. Wishes to stay at her current dose.  Has recent had left shoulder dislocation. Has had this numerous times in the past and had surgery years ago. Needs referral back to Ortho.  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   Recurrent dislocation of shoulder joint 05/16/2012    Social Hx   Social History   Socioeconomic History   Marital status: Single    Spouse name: Not on file   Number of children: Not on file   Years of education: Not on file   Highest education level: Bachelor's degree (e.g., BA, AB, BS)  Occupational History   Not on file  Tobacco Use   Smoking status: Never   Smokeless tobacco: Never  Vaping Use   Vaping status: Never Used  Substance and Sexual Activity   Alcohol use: No   Drug use: No   Sexual activity: Not Currently    Birth control/protection: Implant, Pill  Other Topics Concern   Not on file  Social History Narrative   Lives with father   R handed   Caffeine: 4-5 16oz cans a week.    Social Determinants of Health   Financial Resource Strain: Not on file  Food Insecurity: Not on file  Transportation Needs: Not on file  Physical Activity: Not on file  Stress: Not on file  Social Connections: Not on file    Review of Systems Per HPI  Objective:  BP 121/80   Pulse 73   Temp 98.4 F (36.9 C) (Temporal)   Wt 206 lb (93.4 kg)   SpO2 100%   BMI 31.32 kg/m      03/22/2023     1:50 PM 02/09/2023    1:44 PM 11/29/2022    9:28 AM  BP/Weight  Systolic BP 121 111 111  Diastolic BP 80 73 76  Wt. (Lbs) 206 208 201  BMI 31.32 kg/m2 31.63 kg/m2 30.56 kg/m2    Physical Exam Vitals reviewed.  Constitutional:      General: She is not in acute distress.    Appearance: Normal appearance.  HENT:     Head: Normocephalic and atraumatic.  Cardiovascular:     Rate and Rhythm: Normal rate and regular rhythm.  Pulmonary:     Effort: Pulmonary effort is normal.     Breath sounds: Normal breath sounds.  Neurological:     Mental Status: She is alert.  Psychiatric:        Mood and Affect: Mood normal.        Behavior: Behavior normal.     Lab Results  Component Value Date   WBC 9.1 08/03/2021   HGB 13.3 08/03/2021   HCT 40.7 08/03/2021   PLT 368 08/03/2021   GLUCOSE 76 08/03/2021   CHOL 155 08/03/2021   TRIG 117 08/03/2021   HDL 37 (L) 08/03/2021   LDLCALC 97 08/03/2021   ALT 11 08/03/2021   AST 15  08/03/2021   NA 137 08/03/2021   K 3.7 08/03/2021   CL 102 08/03/2021   CREATININE 0.77 08/03/2021   BUN 13 08/03/2021   CO2 21 08/03/2021   TSH 2.930 03/30/2016   HGBA1C 5.2 06/14/2017     Assessment & Plan:   Problem List Items Addressed This Visit       Musculoskeletal and Integument   Recurrent dislocation of shoulder joint    Referring back to ortho given recurrence.      Relevant Orders   Ambulatory referral to Orthopedic Surgery     Other   Anxiety and depression - Primary    Improving. Continue current dose of Prozac. Continue therapy.       Follow-up:  3-6 months  Jaqueline Uber Adriana Simas DO [facility]

## 2023-03-22 NOTE — Assessment & Plan Note (Signed)
Improving. Continue current dose of Prozac. Continue therapy.

## 2023-03-22 NOTE — Patient Instructions (Signed)
Continue your medications.  Referral placed.  Follow up in 3-6 months.

## 2023-03-22 NOTE — Assessment & Plan Note (Signed)
Referring back to ortho given recurrence.

## 2023-03-28 ENCOUNTER — Ambulatory Visit: Payer: PRIVATE HEALTH INSURANCE | Admitting: Adult Health

## 2023-04-28 ENCOUNTER — Encounter: Payer: Self-pay | Admitting: Family Medicine

## 2023-04-30 ENCOUNTER — Other Ambulatory Visit: Payer: Self-pay | Admitting: Family Medicine

## 2023-04-30 ENCOUNTER — Other Ambulatory Visit: Payer: Self-pay | Admitting: Neurology

## 2023-04-30 DIAGNOSIS — Z30011 Encounter for initial prescription of contraceptive pills: Secondary | ICD-10-CM

## 2023-04-30 MED ORDER — FLUOXETINE HCL 10 MG PO TABS: 10.0000 mg | ORAL_TABLET | Freq: Every day | ORAL | 3 refills | Status: DC

## 2023-04-30 MED ORDER — NORGESTIMATE-ETH ESTRADIOL 0.25-35 MG-MCG PO TABS: 1.0000 | ORAL_TABLET | Freq: Every day | ORAL | 12 refills | Status: DC

## 2023-04-30 MED ORDER — AMPHETAMINE-DEXTROAMPHETAMINE 10 MG PO TABS: 10.0000 mg | ORAL_TABLET | Freq: Two times a day (BID) | ORAL | 0 refills | Status: DC

## 2023-04-30 NOTE — Telephone Encounter (Signed)
Pt is requesting a refill for amphetamine-dextroamphetamine (ADDERALL) 10 MG tablet .  Pharmacy:  [facility] [phone] -

## 2023-05-21 ENCOUNTER — Encounter: Payer: Self-pay | Admitting: Adult Health

## 2023-05-21 ENCOUNTER — Ambulatory Visit (INDEPENDENT_AMBULATORY_CARE_PROVIDER_SITE_OTHER): Payer: 59 | Admitting: Adult Health

## 2023-05-21 VITALS — BP 115/76 | HR 70 | Ht 68.0 in | Wt 208.0 lb

## 2023-05-21 DIAGNOSIS — G4711 Idiopathic hypersomnia with long sleep time: Secondary | ICD-10-CM

## 2023-05-21 NOTE — Progress Notes (Signed)
[facility] [address] [place]. [place] [address] [phone]       OFFICE FOLLOW UP NOTE  Ms. Lauren Park Date of Birth:  1996/01/12 Medical Record Number:  [id number]    Primary neurologist: Dr. Vickey Huger Reason for visit: Idiopathic hypersomnia    SUBJECTIVE:  CHIEF COMPLAINT:  Chief Complaint  Patient presents with   Follow-up    Rm 8, here alone Pt is following up on idiopathic hypersomnia. Pt she is doing well. States she still feels a little tired during the day, but the Adderall seems to be working well     Follow-up visit:  Prior visit: 09/14/2022 Dr. Vickey Huger  Brief HPI:   Lauren Park is a 27 y.o. female who is being followed by Dr. Vickey Huger for idiopathic hypersomnia. 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  Prior sleep study negative for sleep apnea, proceeded with MSLT which showed mean sleep latency of 7.7 minutes for 5 naps but did not have any abrupt onset, REM sleep latency during PSG was over 80 minutes.  At prior visit, she was started on armodafinil 250 mg 0.5 tab AM and 0.5mg  PRN before lunchtime.   During the interval time, patient sent MyChart message noting improvement of daytime sleepiness on armodafinil but unfortunately experiencing mood swings therefore recommended discontinuing.  Patient noticed significant increased fatigue and concentration difficulties since discontinuing therefore initiated Adderall 10mg  daily, was eventually increased to 10 mg BID in September.    Interval history:  Currently taking Adderall 10 mg every morning usually between 6:30am-8am and will try to take 2nd dose between 12pm-2pm, sometimes will miss 2nd dose as she needs to eat prior otherwise will have a headache. She does have occasional increased anxiety/heart racing sensation after taking but able to work through this, otherwise denies any side effects. Has noted benefit in regards to daytime fatigue since starting Adderall. 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   Migraines    Shoulder dislocation 06/2012   left    PSH:  Past Surgical History:  Procedure Laterality Date   CLOSED REDUCTION SHOULDER DISLOCATION     x 2 - under conscious sedation   SHOULDER ARTHROSCOPY WITH BANKART REPAIR  07/05/2012   Procedure: SHOULDER ARTHROSCOPY WITH BANKART REPAIR;  Surgeon: Eulas Post, MD;  Location: [facility];  Service: Orthopedics;  Laterality: Left;  LEFT SHOULDER  ARTHROSCOPY CAPSULORRHAPHY    Social History:  Social History   Socioeconomic History   Marital status: Single    Spouse name: Not on file   Number of children: Not on file   Years of education: Not on file   Highest education level: Bachelor's degree (e.g., BA, AB, BS)  Occupational History   Not on file  Tobacco Use   Smoking status: Never   Smokeless tobacco: Never  Vaping Use   Vaping status: Never Used  Substance and Sexual Activity   Alcohol use: No   Drug use: No   Sexual activity: Not Currently    Birth control/protection: Implant, Pill  Other Topics Concern   Not on file  Social History Narrative   Lives with father   R handed   Caffeine: 4-5 16oz cans a week.  Social Determinants of Health   Financial Resource Strain: Not on file  Food Insecurity: Not on file  Transportation Needs: Not on file  Physical Activity: Not on file  Stress: Not on file  Social Connections: Not on file  Intimate Partner Violence: Not on file    Family History:  Family History  Problem Relation Age of Onset   High blood pressure Mother    Heart attack Mother    Depression Mother    Uterine cancer Mother    Breast cancer Maternal Uncle    Stroke Maternal Grandmother    Heart attack Maternal Grandmother     Depression Maternal Grandmother    High blood pressure Maternal Grandmother    Depression Maternal Grandfather    High blood pressure Maternal Grandfather    Brain cancer Maternal Grandfather     Medications:   Current Outpatient Medications on File Prior to Visit  Medication Sig Dispense Refill   amphetamine-dextroamphetamine (ADDERALL) 10 MG tablet Take 1 tablet (10 mg total) by mouth 2 (two) times daily with a meal. 60 tablet 0   FLUoxetine (PROZAC) 10 MG tablet Take 1 tablet (10 mg total) by mouth daily. 90 tablet 3   norgestimate-ethinyl estradiol (SPRINTEC 28) 0.25-35 MG-MCG tablet Take 1 tablet by mouth daily. 28 tablet 12   No current facility-administered medications on file prior to visit.    Allergies:   Allergies  Allergen Reactions   Insect Extract Swelling    BEES, MOSQUITOES   Poison Sumac Extract Rash      OBJECTIVE:  Physical Exam  Vitals:   05/21/23 1304  BP: 115/76  Pulse: 70  Weight: 208 lb (94.3 kg)  Height: 5\' 8"  (1.727 m)   Body mass index is 31.63 kg/m. No results found.   General: well developed, well nourished, very pleasant young Caucasian female, seated, in no evident distress  Neurologic Exam Mental Status: Awake and fully alert. Oriented to place and time. Recent and remote memory intact. Attention span, concentration and fund of knowledge appropriate. Mood and affect appropriate.  Cranial Nerves: Pupils equal, briskly reactive to light. Extraocular movements full without nystagmus. Visual fields full to confrontation. Hearing intact. Facial sensation intact. Face, tongue, palate moves normally and symmetrically.  Motor: Normal bulk and tone. Normal strength in all tested extremity muscles Gait and Station: Arises from chair without difficulty. Stance is normal. Gait demonstrates normal stride length and balance without use of AD.  Reflexes: 1+ and symmetric. Toes downgoing.         ASSESSMENT/PLAN: Lauren Park is a 27 y.o.  year old female      Idiopathic hypersomnia:  Continue Adderall IR 10mg  every morning and 10mg  afternoon PRN, has mild side effects of increased anxiety/heart racing occasionally after taking but tolerable and lasts short duration and occasional headache if she doesn't eat prior to dose. Discussed trialing Adderall XR to take every morning for possible less side effects but declines interest at this time Intolerant to armodafinil MLST 06/2023 mean sleep latency of 7.7 minutes for 5 naps but none with REM sleep onset HLA positive for 1 allele     Follow up in 6 months or call earlier if needed   CC:  PCP: Tommie Sams, DO    I spent 20 minutes of face-to-face and non-face-to-face time with patient.  This included previsit chart review, lab review, study review, order entry, electronic health record documentation, patient education and discussion regarding above diagnoses and treatment plan and answered all  other questions to patient's satisfaction  Ihor Austin, [facility]  [facility] [address] [place], [place] [address]  Phone [phone] Fax [phone] Note: This document was prepared with digital dictation and possible smart phrase technology. Any transcriptional errors that result from this process are unintentional.

## 2023-05-21 NOTE — Patient Instructions (Addendum)
Your Plan:  Continue Adderall IR 10mg  twice daily   Please let me know if you would be interested in trying extended release formulation in the morning which may help with some of the side effects      Follow up in 6 months or call earlier if needed      Thank you for coming to see [place] at [facility]. I hope we have been able to provide you high quality care today.  You may receive a patient satisfaction survey over the next few weeks. We would appreciate your feedback and comments so that we may continue to improve ourselves and the health of our patients.

## 2023-05-22 ENCOUNTER — Encounter: Payer: Self-pay | Admitting: Family Medicine

## 2023-05-22 ENCOUNTER — Other Ambulatory Visit: Payer: Self-pay | Admitting: Family Medicine

## 2023-05-22 MED ORDER — FLUOXETINE HCL 20 MG PO TABS: 20.0000 mg | ORAL_TABLET | Freq: Every day | ORAL | 3 refills | Status: DC

## 2023-05-29 ENCOUNTER — Other Ambulatory Visit: Payer: Self-pay | Admitting: Neurology

## 2023-05-30 MED ORDER — AMPHETAMINE-DEXTROAMPHETAMINE 10 MG PO TABS: 10.0000 mg | ORAL_TABLET | Freq: Two times a day (BID) | ORAL | 0 refills | Status: DC

## 2023-07-02 ENCOUNTER — Other Ambulatory Visit: Payer: Self-pay | Admitting: Neurology

## 2023-07-02 ENCOUNTER — Other Ambulatory Visit: Payer: Self-pay

## 2023-07-02 NOTE — Telephone Encounter (Signed)
Pt last seen 05/21/23 Upcoming Appointment 11/22/23  Adderall last filled 05/30/2023 Escript 07/02/2023

## 2023-07-03 MED ORDER — AMPHETAMINE-DEXTROAMPHETAMINE 10 MG PO TABS: 10.0000 mg | ORAL_TABLET | Freq: Two times a day (BID) | ORAL | 0 refills | Status: DC

## 2023-07-03 NOTE — Telephone Encounter (Signed)
Requested Prescriptions   Pending Prescriptions Disp Refills   amphetamine-dextroamphetamine (ADDERALL) 10 MG tablet 60 tablet 0    Sig: Take 1 tablet (10 mg total) by mouth 2 (two) times daily with a meal.   Last seen 05/21/23 Next appt 11/22/22   Dispenses    Dispensed Days Supply Quantity Provider Pharmacy  AMPHETA/DEXTRO COMBO 10MG  TAB 05/30/2023 30 60 each Sater, Pearletha Furl, MD [facility] [phone] ...  AMPHETA/DEXTRO COMBO 10MG  TAB 04/30/2023 30 60 each Dohmeier, Porfirio Mylar, MD [facility] [phone] ...  AMPHETA/DEXTRO COMBO 10MG  TAB 03/27/2023 30 60 each Dohmeier, Porfirio Mylar, MD [facility] [phone] ...  AMPHETA/DEXTRO COMBO 10MG  TAB 02/27/2023 30 30 each Dohmeier, Porfirio Mylar, MD [facility] [phone] ...  AMPHETA/DEXTRO COMBO 10MG  TAB 01/30/2023 30 30 each Dohmeier, Porfirio Mylar, MD [facility] [phone] .Marland KitchenMarland Kitchen

## 2023-08-07 ENCOUNTER — Other Ambulatory Visit: Payer: Self-pay | Admitting: Neurology

## 2023-08-08 MED ORDER — AMPHETAMINE-DEXTROAMPHETAMINE 10 MG PO TABS: 10.0000 mg | ORAL_TABLET | Freq: Two times a day (BID) | ORAL | 0 refills | Status: DC

## 2023-08-08 NOTE — Telephone Encounter (Signed)
Last seen on 05/21/23 per your last note " Continue Adderall IR 10mg  twice daily " Follow up scheduled on 11/22/23 Last filled on 07/03/23 #60 tablets (30 day supply) Rx pending to be signed

## 2023-09-04 ENCOUNTER — Encounter: Payer: Self-pay | Admitting: Family Medicine

## 2023-09-06 ENCOUNTER — Other Ambulatory Visit: Payer: Self-pay | Admitting: Adult Health

## 2023-09-06 ENCOUNTER — Other Ambulatory Visit: Payer: Self-pay | Admitting: Family Medicine

## 2023-09-06 MED ORDER — AMPHETAMINE-DEXTROAMPHETAMINE 10 MG PO TABS: 10.0000 mg | ORAL_TABLET | Freq: Two times a day (BID) | ORAL | 0 refills | Status: DC

## 2023-09-06 MED ORDER — FLUOXETINE HCL 20 MG PO TABS: 30.0000 mg | ORAL_TABLET | Freq: Every day | ORAL | 1 refills | Status: DC

## 2023-09-06 NOTE — Telephone Encounter (Signed)
 Last seen on 09/14/22 Follow up scheduled on 11/22/23 Last filled on 08/08/23 #60 tablets (30 day supply) Rx pending to be signed

## 2023-10-08 ENCOUNTER — Other Ambulatory Visit: Payer: Self-pay | Admitting: Adult Health

## 2023-10-08 MED ORDER — AMPHETAMINE-DEXTROAMPHETAMINE 10 MG PO TABS: 10.0000 mg | ORAL_TABLET | Freq: Two times a day (BID) | ORAL | 0 refills | Status: DC

## 2023-11-07 ENCOUNTER — Other Ambulatory Visit: Payer: Self-pay | Admitting: Neurology

## 2023-11-07 MED ORDER — AMPHETAMINE-DEXTROAMPHETAMINE 10 MG PO TABS: 10.0000 mg | ORAL_TABLET | Freq: Two times a day (BID) | ORAL | 0 refills | Status: DC

## 2023-11-07 NOTE — Telephone Encounter (Signed)
 Last seen on 11/124 Follow up scheduled on 11/22/23   Dispensed Days Supply Quantity Provider Pharmacy  AMPHETA/DEXTRO COMBO 10MG  TAB 10/09/2023 30 60 each Sethi, Pramod S, MD [facility] [phone] ...      Rx pending to be signed

## 2023-11-22 ENCOUNTER — Ambulatory Visit: Payer: 59 | Admitting: Adult Health

## 2023-11-22 ENCOUNTER — Encounter: Payer: Self-pay | Admitting: Adult Health

## 2023-11-22 VITALS — BP 136/84 | HR 75 | Ht 68.0 in | Wt 230.0 lb

## 2023-11-22 DIAGNOSIS — F518 Other sleep disorders not due to a substance or known physiological condition: Secondary | ICD-10-CM

## 2023-11-22 DIAGNOSIS — G478 Other sleep disorders: Secondary | ICD-10-CM | POA: Diagnosis not present

## 2023-11-22 DIAGNOSIS — G4711 Idiopathic hypersomnia with long sleep time: Secondary | ICD-10-CM

## 2023-11-22 NOTE — Progress Notes (Signed)
 [facility] [address] [place]. [place] [address] [phone]       OFFICE FOLLOW UP NOTE  Ms. Lauren Park Date of Birth:  1996-05-15 Medical Record Number:  [id number]    Primary neurologist: Dr. Albertina Hugger Reason for visit: Idiopathic hypersomnia    SUBJECTIVE:  CHIEF COMPLAINT:  Chief Complaint  Patient presents with   idopathic hypersomnia    Rm 8 alone Pt is well and stable, reports she is tolerating adderall well and feel she can function. She is still having some fatigued during stationary time.     Follow-up visit:  Prior visit: 05/21/2023  Brief HPI:   Lauren Park is a 28 y.o. 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  Prior sleep study negative for sleep apnea, proceeded with MSLT which showed mean sleep latency of 7.7 minutes for 5 naps but did not have any abrupt onset, REM sleep latency during PSG was over 80 minutes.  Initially started on armodafinil  but eventually discontinued due to mood swing concerns and was transitioned to Adderall in 01/2023.   At prior visit, noted benefit with use of Adderall 10mg  AM and 10mg  afternoon PRN.    Interval history:  Returns for follow-up visit.  Reports overall stable since prior visit.  Continues on Adderall every morning and usually takes second dose between 12pm-2pm.  Does report some improvement of anxiety after taking Adderall dosage especially if she takes when she is busy.  She can still struggle with fatigue when sedentary, she can easily fall asleep if reading.  She does have not a consistent sleep schedule primarily due to her job but she tries to sleep for at least 7-8 hrs per night although some nights this can be less.  She wakes up feeling unrefreshed even on nights where she feels she gets adequate amount of sleep.  She does have quite vivid dreams, at times can wake up sweating, she feels much less rested when she has  intense dreams which happens quite frequently.  She does continue to struggle with anxiety but feels this has improved after recent dosage increase of fluoxetine  30 mg daily. ESS 17/24 (higher than previous as she previously gave scores as if she was pushing herself to stay awake and today gave scores as if she was not pushing herself). FSS 43/63.       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   Migraines    Shoulder dislocation 06/2012   left    PSH:  Past Surgical History:  Procedure Laterality Date   CLOSED REDUCTION SHOULDER DISLOCATION     x 2 - under conscious sedation   SHOULDER ARTHROSCOPY WITH BANKART REPAIR  07/05/2012   Procedure: SHOULDER ARTHROSCOPY WITH BANKART REPAIR;  Surgeon: Neville Barbone, MD;  Location: [facility];  Service: Orthopedics;  Laterality: Left;  LEFT SHOULDER  ARTHROSCOPY CAPSULORRHAPHY    Social History:  Social History   Socioeconomic History   Marital status: Single    Spouse name: Not on file   Number of children: Not on file   Years of education: Not on file   Highest education level: Bachelor's degree (e.g., BA, AB, BS)  Occupational History   Not  on file  Tobacco Use   Smoking status: Never   Smokeless tobacco: Never  Vaping Use   Vaping status: Never Used  Substance and Sexual Activity   Alcohol use: No   Drug use: No   Sexual activity: Not Currently    Birth control/protection: Implant, Pill  Other Topics Concern   Not on file  Social History Narrative   Lives with father   R handed   Caffeine: 4-5 16oz cans a week.    Social Drivers of Corporate investment banker Strain: Not on file  Food Insecurity: Not on file  Transportation Needs: Not on file  Physical Activity: Not on file  Stress: Not on file  Social  Connections: Not on file  Intimate Partner Violence: Not on file    Family History:  Family History  Problem Relation Age of Onset   High blood pressure Mother    Heart attack Mother    Depression Mother    Uterine cancer Mother    Breast cancer Maternal Uncle    Stroke Maternal Grandmother    Heart attack Maternal Grandmother    Depression Maternal Grandmother    High blood pressure Maternal Grandmother    Depression Maternal Grandfather    High blood pressure Maternal Grandfather    Brain cancer Maternal Grandfather     Medications:   Current Outpatient Medications on File Prior to Visit  Medication Sig Dispense Refill   amphetamine -dextroamphetamine  (ADDERALL) 10 MG tablet Take 1 tablet (10 mg total) by mouth 2 (two) times daily with a meal. 60 tablet 0   FLUoxetine  (PROZAC ) 20 MG tablet Take 1.5 tablets (30 mg total) by mouth daily. 135 tablet 1   norgestimate -ethinyl estradiol  (SPRINTEC 28) 0.25-35 MG-MCG tablet Take 1 tablet by mouth daily. 28 tablet 12   No current facility-administered medications on file prior to visit.    Allergies:   Allergies  Allergen Reactions   Insect Extract Swelling    BEES, MOSQUITOES   Poison Sumac Extract Rash      OBJECTIVE:  Physical Exam  Vitals:   11/22/23 1532  BP: 136/84  Pulse: 75  Weight: 230 lb (104.3 kg)  Height: 5\' 8"  (1.727 m)    Body mass index is 34.97 kg/m. No results found.   General: well developed, well nourished, very pleasant young Caucasian female, seated, in no evident distress  Neurologic Exam Mental Status: Awake and fully alert. Oriented to place and time. Recent and remote memory intact. Attention span, concentration and fund of knowledge appropriate. Mood and affect appropriate.  Cranial Nerves: Pupils equal, briskly reactive to light. Extraocular movements full without nystagmus. Visual fields full to confrontation. Hearing intact. Facial sensation intact. Face, tongue, palate moves normally  and symmetrically.  Motor: Normal bulk and tone. Normal strength in all tested extremity muscles Gait and Station: Arises from chair without difficulty. Stance is normal. Gait demonstrates normal stride length and balance without use of AD.  Reflexes: 1+ and symmetric. Toes downgoing.         ASSESSMENT/PLAN: Lauren Park is a 28 y.o. year old female      Idiopathic hypersomnia:  Reports improvement of daytime fatigue overall since starting Adderall although still persists especially when sedentary, also mentions frequent intense vivid dreams that can wake her up sweating and will feel much less rested in the morning. Continued high ESS and FSS, prolonged discussion regarding other treatment options.  Discussed making dosage adjustment to Adderall but she is concerned of potential side  effects.  Declines interest in transitioning Adderall IR to XR.  Discussed trial of Xywav but is not interested currently as as her sleep schedule in not consistent. She is also understandably hesitant to trial something to help her sleep at night due to inconsistent sleep schedule and potential of making her feel even more unrested in the morning Continue Adderall IR 10mg  every morning and 10mg  afternoon PRN - declines interest in  Intolerant to armodafinil  MLST 06/2023 mean sleep latency of 7.7 minutes for 5 naps but none with REM sleep onset HLA positive for 1 allele     Follow up in 6 months with Dr. Albertina Hugger for further treatment recommendations or call earlier if needed   CC:  PCP: Cook, Jayce G, DO    I spent 45 minutes of face-to-face and non-face-to-face time with patient.  This included previsit chart review, lab review, study review, order entry, electronic health record documentation, patient education and discussion regarding above diagnoses and treatment plan and answered all other questions to patient's satisfaction  Johny Nap, [facility]  [facility] [address] [place], [place] [address]  Phone [phone] Fax [phone] Note: This document was prepared with digital dictation and possible smart phrase technology. Any transcriptional errors that result from this process are unintentional.

## 2023-11-22 NOTE — Patient Instructions (Addendum)
 Your Plan:  Continue Adderall 10mg  twice daily for now  Please call prior to next visit if you are interested in doing any medication changes      Follow up in 6 months with Dr. Albertina Hugger or call earlier if needed      Thank you for coming to see us  at [facility]. I hope we have been able to provide you high quality care today.  You may receive a patient satisfaction survey over the next few weeks. We would appreciate your feedback and comments so that we may continue to improve ourselves and the health of our patients.

## 2023-12-11 ENCOUNTER — Other Ambulatory Visit: Payer: Self-pay | Admitting: Neurology

## 2023-12-11 ENCOUNTER — Encounter: Payer: Self-pay | Admitting: Adult Health

## 2023-12-11 MED ORDER — AMPHETAMINE-DEXTROAMPHETAMINE 10 MG PO TABS: 10.0000 mg | ORAL_TABLET | Freq: Two times a day (BID) | ORAL | 0 refills | Status: DC

## 2024-01-24 ENCOUNTER — Encounter: Payer: Self-pay | Admitting: Family Medicine

## 2024-01-25 ENCOUNTER — Other Ambulatory Visit: Payer: Self-pay | Admitting: Family Medicine

## 2024-01-25 MED ORDER — FLUOXETINE HCL 20 MG PO TABS: 30.0000 mg | ORAL_TABLET | Freq: Every day | ORAL | 3 refills | Status: AC

## 2024-02-06 ENCOUNTER — Other Ambulatory Visit: Payer: Self-pay | Admitting: *Deleted

## 2024-02-06 ENCOUNTER — Encounter: Payer: Self-pay | Admitting: Adult Health

## 2024-02-06 MED ORDER — AMPHETAMINE-DEXTROAMPHETAMINE 10 MG PO TABS: 10.0000 mg | ORAL_TABLET | Freq: Two times a day (BID) | ORAL | 0 refills | Status: DC

## 2024-02-06 NOTE — Telephone Encounter (Signed)
 Pt sent mychart asking for refill of adderall. Last refilled 12/11/23 #60. Last seen 11/22/23 and next f/u 05/29/24.

## 2024-03-27 ENCOUNTER — Encounter: Payer: Self-pay | Admitting: Adult Health

## 2024-03-31 MED ORDER — AMPHETAMINE-DEXTROAMPHETAMINE 10 MG PO TABS: 10.0000 mg | ORAL_TABLET | Freq: Two times a day (BID) | ORAL | 0 refills | Status: DC

## 2024-03-31 NOTE — Telephone Encounter (Signed)
 Requested Prescriptions   Pending Prescriptions Disp Refills   amphetamine -dextroamphetamine  (ADDERALL) 10 MG tablet 60 tablet 0    Sig: Take 1 tablet (10 mg total) by mouth 2 (two) times daily with a meal.   Last seen 11/22/23 Next appt 05/29/24  Dispenses   Dispensed Days Supply Quantity Provider Pharmacy  AMPHETA/DEXTRO COMBO 10MG  TAB 02/06/2024 30 60 each Whitfield Raisin, NP [facility] [phone] ...  AMPHETA/DEXTRO COMBO 10MG  TAB 12/11/2023 30 60 each Whitfield Raisin, NP [facility] [phone] ...  AMPHETA/DEXTRO COMBO 10MG  TAB 11/07/2023 30 60 each Whitfield Raisin, NP [facility] [phone] ...  AMPHETA/DEXTRO COMBO 10MG  TAB 10/09/2023 30 60 each Sethi, Pramod S, MD [facility] [phone] ...  AMPHETA/DEXTRO COMBO 10MG  TAB 09/10/2023 30 60 each Whitfield Raisin, NP [facility] [phone] ...  AMPHETA/DEXTRO COMBO 10MG  TAB 08/08/2023 30 60 each Whitfield Raisin, NP [facility] [phone] ...  AMPHETA/DEXTRO COMBO 10MG  TAB 07/03/2023 30 60 each Sethi, Pramod S, MD [facility] [phone] ...  AMPHETA/DEXTRO COMBO 10MG  TAB 05/30/2023 30 60 each Sater, Charlie LABOR, MD [facility] [phone] ...  AMPHETA/DEXTRO COMBO 10MG  TAB 04/30/2023 30 60 each Dohmeier, Dedra, MD [facility] [phone] .SABRASABRA

## 2024-04-17 ENCOUNTER — Encounter: Payer: Self-pay | Admitting: Adult Health

## 2024-04-17 DIAGNOSIS — G4711 Idiopathic hypersomnia with long sleep time: Secondary | ICD-10-CM

## 2024-04-22 MED ORDER — AMPHETAMINE-DEXTROAMPHETAMINE 10 MG PO TABS: ORAL_TABLET | ORAL | 0 refills | Status: AC

## 2024-05-21 ENCOUNTER — Other Ambulatory Visit: Payer: Self-pay | Admitting: Family Medicine

## 2024-05-21 DIAGNOSIS — Z30011 Encounter for initial prescription of contraceptive pills: Secondary | ICD-10-CM

## 2024-05-29 ENCOUNTER — Ambulatory Visit: Admitting: Neurology

## 2024-05-29 ENCOUNTER — Encounter: Payer: Self-pay | Admitting: Neurology

## 2024-05-29 VITALS — BP 139/87 | HR 72 | Ht 68.0 in | Wt 251.0 lb

## 2024-05-29 DIAGNOSIS — R6889 Other general symptoms and signs: Secondary | ICD-10-CM | POA: Diagnosis not present

## 2024-05-29 DIAGNOSIS — G4711 Idiopathic hypersomnia with long sleep time: Secondary | ICD-10-CM

## 2024-05-29 MED ORDER — AMPHETAMINE-DEXTROAMPHET ER 30 MG PO CP24: 30.0000 mg | ORAL_CAPSULE | Freq: Every day | ORAL | 0 refills | Status: DC

## 2024-05-29 NOTE — Patient Instructions (Addendum)
 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 Last MSLT was SREM negative, HLA test  was positive  DQA1*01:02 Positive  DQB1*06:02 Negative   Her brother has sleep paralysis.   2) insufficient control of  sleepiness on the previous dose of adderall . Not much improvement by doublig he dose thus far.  12-13 hours is her lull time.  Plan : change to adderall XR 30 mg in AM and then leave prn option for IR adderall 10 mg. If wel tolerated, will use 90 days supply.   Plan B : Wakix, Xyrem , Xywav or Lumeryz.   Add magnesium Glycnate OTC. At night time.    Follow up with Harlene Bogaert, NP , in 6 months.    Here is some information about alternative medications named as Plan B.   Pitolisant Tablets ( wakix)  What is this medication? PITOLISANT (pi TOL i sant) treats narcolepsy. It works by decreasing daytime sleepiness, which can help you sleep better at night. It also reduces the number of episodes of sudden muscle weakness (cataplexy). This medicine may be used for other purposes; ask your health care provider or pharmacist if you have questions. COMMON BRAND NAME(S): Wakix What should I tell my care team before I take this medication? They need to know if you have any of these conditions: Heart disease History of irregular heartbeat Kidney disease Liver disease Low levels of potassium in the blood Low levels of magnesium in the blood An unusual or allergic reaction to pitolisant, other medications, foods, dyes or preservatives Pregnant or trying to get pregnant Breastfeeding How should I use this medication? Take this medication by mouth with water. Take it as directed on the prescription label at the same time every day. You can take it with or without food. If it upsets your stomach, take it with food. Keep taking it unless your care  team tells you to stop. Talk to your care team about the use of this medication in children. While it may be prescribed for children as young as 6 years for selected conditions, precautions do apply. Overdosage: If you think you have taken too much of this medicine contact a poison control center or emergency room at once. NOTE: This medicine is only for you. Do not share this medicine with others. What if I miss a dose? If you miss a dose, skip it. Take your next dose at the normal time. Do not take extra or 2 doses at the same time to make up for the missed dose. What may interact with this medication? Do not take this medication with any of the following: Cisapride Dronedarone Pimozide Thioridazine This medication may also interact with the following: Antihistamines for allergy, cough and cold Certain medications for irregular heartbeat, such as amiodarone, dofetilide, encainide, flecainide, propafenone, quinidine Certain medications for depression, anxiety, or other mental health conditions Certain medications for seizures, such as carbamazepine, phenobarbital, phenytoin Cyclosporine Estrogen or progestin hormones Midazolam  Other medications that cause heart rhythm changes Promethazine  Rifampin St. John's wort This list may not describe all possible interactions. Give your health care provider a list of all the medicines, herbs, non-prescription drugs, or dietary supplements you use. Also tell them if you smoke, drink alcohol, or use illegal drugs. Some items may interact with your medicine. What should I watch for while using this medication? Visit your care team  for regular checks on your progress. Tell your care team if your symptoms do not start to get better or if they get worse. Your mouth may get dry. Chewing sugarless gum or sucking hard candy and drinking plenty of water may help. Contact your care team if the problem does not go away or is severe. Estrogen and progestin  hormones may not work as well while you are taking this medication. If you are using these hormones for contraception, talk to your care team about using a second type of contraception. A barrier contraceptive, such as a condom or diaphragm, is recommended. What side effects may I notice from receiving this medication? Side effects that you should report to your care team as soon as possible: Allergic reactions--skin rash, itching, hives, swelling of the face, lips, tongue, or throat Heart rhythm changes--fast or irregular heartbeat, dizziness, feeling faint or lightheaded, chest pain, trouble breathing Side effects that usually do not require medical attention (report these to your care team if they continue or are bothersome): Anxiety, nervousness Headache Nausea Trouble sleeping This list may not describe all possible side effects. Call your doctor for medical advice about side effects. You may report side effects to FDA at 1-800-FDA-1088. Where should I keep my medication? Keep out of the reach of children and pets. Store at room temperature between 20 and 25 degrees C (68 and 77 degrees F). Get rid of any unused medication after the expiration date. To get rid of medications that are no longer needed or have expired: Take the medication to a medication take-back program. Check with your pharmacy or law enforcement to find a location. If you cannot return the medication, check the label or package insert to see if the medication should be thrown out in the garbage or flushed down the toilet. If you are not sure, ask your care team. If it is safe to put it in the trash, empty the medication out of the container. Mix the medication with cat litter, dirt, or other unwanted substance. Seal the mixture in a bag or container. Put it in the trash. NOTE: This sheet is a summary. It may not cover all possible information. If you have questions about this medicine, talk to your doctor, pharmacist, or  health care provider.  2024 Elsevier/Gold Standard (2023-01-03 00:00:00)   Sunosi_ Solriamfetol Tablets What is this medication? SOLRIAMFETOL (sol ri AM fe tol) treats sleep disorders, such as narcolepsy and obstructive sleep apnea. It works by promoting wakefulness. This medicine may be used for other purposes; ask your health care provider or pharmacist if you have questions. COMMON BRAND NAME(S): SUNOSI What should I tell my care team before I take this medication? They need to know if you have any of these conditions: Bipolar disorder Diabetes Heart disease High blood pressure High cholesterol History of stroke Kidney disease Schizophrenia Substance use disorder An unusual or allergic reaction to solriamfetol, other medications, foods, dyes, or preservatives Pregnant or trying to get pregnant Breast-feeding How should I use this medication? Take this medication by mouth with water. Take it as directed on the prescription label at the same time every day. You can take it with or without food. If it upsets your stomach, take it with food. Keep taking it unless your care team tells you to stop. A special MedGuide will be given to you by the pharmacist with each prescription and refill. Be sure to read this information carefully each time. Talk to your care team about the use  of this medication in children. Special care may be needed. Overdosage: If you think you have taken too much of this medicine contact a poison control center or emergency room at once. NOTE: This medicine is only for you. Do not share this medicine with others. What if I miss a dose? If you miss a dose, take it as soon as you can. However, avoid taking it within 9 hours of your planned bedtime, since you may find it harder to go to sleep. If it is almost time for your next dose, take only that dose. Do not take double or extra doses. What may interact with this medication? Do not take this medication with any of  the following: MAOIs, such as Marplan, Nardil, and Parnate This medication may also interact with the following: Certain medications for Parkinson disease, such as levodopa, pramipexole, ropinirole Medications that increase blood pressure or heart rate This list may not describe all possible interactions. Give your health care provider a list of all the medicines, herbs, non-prescription drugs, or dietary supplements you use. Also tell them if you smoke, drink alcohol, or use illegal drugs. Some items may interact with your medicine. What should I watch for while using this medication? Visit your care team for regular checks on your progress. Tell your care team if your symptoms do not start to get better or if they get worse. This medication has a risk of abuse and dependence. Your care team will check you for this while you take this medication. Talk to your care team before breastfeeding. Changes to your treatment plan may be needed. What side effects may I notice from receiving this medication? Side effects that you should report to your care team as soon as possible: Allergic reactions--skin rash, itching, hives, swelling of the face, lips, tongue, or throat Fast or irregular heartbeat Heart attack--pain or tightness in the chest, shoulders, arms, or jaw, nausea, shortness of breath, cold or clammy skin, feeling faint or lightheaded Increase in blood pressure Mood and behavior changes--anxiety, nervousness, confusion, hallucinations, irritability, hostility, thoughts of suicide or self-harm, worsening mood, feelings of depression Stroke--sudden numbness or weakness of the face, arm, or leg, trouble speaking, confusion, trouble walking, loss of balance or coordination, dizziness, severe headache, change in vision Side effects that usually do not require medical attention (report these to your care team if they continue or are bothersome): Anxiety, nervousness Headache Loss of  appetite Nausea Trouble sleeping This list may not describe all possible side effects. Call your doctor for medical advice about side effects. You may report side effects to FDA at 1-800-FDA-1088. Where should I keep my medication? Keep out of the reach of children and pets. This medication can be abused. Keep it in a safe place to protect it from theft. Do not share it with anyone. It is only for you. Selling or giving away this medication is dangerous and against the law. Store at room temperature between 20 and 25 degrees C (68 and 77 degrees F). This medication may cause harm or death if it is taken by other adults, children, or pets. It is important to get rid of the medication as soon as you no longer need it or it is expired. You can do this in two ways: Take the medication to a medication take-back program. Check with your pharmacy or law enforcement to find a location. If you cannot return the medication, check the label or package insert to see if the medication should be thrown out  in the garbage or flushed down the toilet. If you are not sure, ask your care team. If it is safe to put it in the trash, empty the medication out of the container. Mix the medication with cat litter, dirt, or other unwanted substance. Seal the mixture in a bag or container. Put it in the trash. NOTE: This sheet is a summary. It may not cover all possible information. If you have questions about this medicine, talk to your doctor, pharmacist, or health care provider.  2024 Elsevier/Gold Standard (2022-01-13 00:00:00)

## 2024-05-29 NOTE — Progress Notes (Signed)
 ESS 17/24 (higher than previous as she previously gave scores as if she was pushing herself to stay awake and today gave scores as if she was not pushing herself). FSS 43/63.  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   On adderall as only medication for sleepiness.           Provider:  Dedra Gores, MD  Primary Care Physician:  Cook, Jayce G, DO [address] [place] [address]     Referring Provider: Cook, Jayce G, Do [address] [place],  [place] [address]          Chief Complaint according to patient   Patient presents with:                HISTORY OF PRESENT ILLNESS:  Lauren Park is a 28 y.o. female patient who is here for revisit 05/29/2024 for  idiopathic hypersomnia,  having had a poor response to Armodafinil , and now on adderall since 3 years .  Chief concern according to patient :  Persistent hypersomnia, increased adderall dose and still no effect.   20 mg ir adderall in AM and another 10 mg dose after or with lunch.         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 Stays at [employer] home over the weekends.    Initial consult.  Lauren Park is a 28 y.o. year old White or Caucasian female patient seen here as a referral on 11/23/2021 from NP [facility] for a Sleep evaluation.  Chief concern according to patient :  Rm , alone. Pt referred for decreased quality of sleep, feeling exhausted in the mornings after sleep, mild headaches in the morning, waking up nauseated, and since COVID having vivid dreams every night. Vivid dreams became daily after having COVID 2 years ago. Fatigued more than sleepiness.   Patient is aware of talking and moving some at night. Sleeps on her side and has to constantly turn sides to avoid neck pn. Has been having leg cramps at night usually w bad dreams. Pt unsure if she snores. She lives with her father, mother died  years ago.   Lauren Park  is here to be evaluated for a possible sleep disorder.  She has recently seen a Therapist, Sports for anxiety.  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   Sleep relevant medical history: Vivid dreams, falling asleep easily, but dreams interrupt sleep, dreams are starting as soon as she is drowsy,  in installments, but not lucid dreaming.     Family medical /sleep history: Father with OSA, 2 younger siblings have no sleep issues. Brother is a lucid dreamer    Social history:  Patient is working as a recruitment consultant and lives in a household with step father  . The patient currently works day shift. Pets : one dog and one cat . Tobacco use- none .  ETOH use- none ,  Caffeine intake in form of pepsi (  sometime 2 a day, sometimes none )  monster ( 5/ week)  Regular exercise - none.      Review of Systems: Out of a complete 14 system review, the patient complains of only the following symptoms, and all other reviewed systems are negative.:   SLEEPINESS ?  How likely are you  to doze in the following situations: 0 = not likely, 1 = slight chance, 2 = moderate chance, 3 = high chance  Sitting and Reading? Watching Television? Sitting inactive in a public place (theater or meeting)? Lying down in the afternoon when circumstances permit? Sitting and talking to someone? Sitting quietly after lunch without alcohol? In a car, while stopped for a few minutes in traffic? As a passenger in a car for an hour without a break?  Total = Sleepiness affecting the clinic work, her driving, etc. ESS 80/ 24. FSS 46/ 63 points.        Social History   Socioeconomic History   Marital status: Single    Spouse name: Not on file   Number of children: Not on file   Years of education: Not on file   Highest education level: Bachelor's degree (e.g., BA, AB, BS)  Occupational History   Not on file   Tobacco Use   Smoking status: Never   Smokeless tobacco: Never  Vaping Use   Vaping status: Never Used  Substance and Sexual Activity   Alcohol use: No   Drug use: No   Sexual activity: Not Currently    Birth control/protection: Implant, Pill  Other Topics Concern   Not on file  Social History Narrative   Lives with father   R handed   Caffeine: 4-5 16oz cans a week.    Social Drivers of Corporate Investment Banker Strain: Not on file  Food Insecurity: Not on file  Transportation Needs: Not on file  Physical Activity: Not on file  Stress: Not on file  Social Connections: Not on file    Family History  Problem Relation Age of Onset   High blood pressure Mother    Heart attack Mother    Depression Mother    Uterine cancer Mother    Breast cancer Maternal Uncle    Stroke Maternal Grandmother    Heart attack Maternal Grandmother    Depression Maternal Grandmother    High blood pressure Maternal Grandmother    Depression Maternal Grandfather    High blood pressure Maternal Grandfather    Brain cancer Maternal Grandfather     Past Medical History:  Diagnosis Date   Anterior subluxation of humerus 06/2012   left   Depression    no current med.   Migraines    Shoulder dislocation 06/2012   left    Past Surgical History:  Procedure Laterality Date   CLOSED REDUCTION SHOULDER DISLOCATION     x 2 - under conscious sedation   SHOULDER ARTHROSCOPY WITH BANKART REPAIR  07/05/2012   Procedure: SHOULDER ARTHROSCOPY WITH BANKART REPAIR;  Surgeon: Fonda SHAUNNA Olmsted, MD;  Location: [facility];  Service: Orthopedics;  Laterality: Left;  LEFT SHOULDER  ARTHROSCOPY CAPSULORRHAPHY     Current Outpatient Medications on File Prior to Visit  Medication Sig Dispense Refill   amphetamine -dextroamphetamine  (ADDERALL) 10 MG tablet Take 2 tablets (20 mg total) by mouth every morning AND 1 tablet (10 mg total) daily after lunch. 90 tablet 0   FLUoxetine  (PROZAC ) 20 MG  tablet Take 1.5 tablets (30 mg total) by mouth daily. 135 tablet 3   SPRINTEC 28 0.25-35 MG-MCG tablet Take 1 tablet by mouth once daily 28 tablet 0   No current facility-administered medications on file prior to visit.    Allergies  Allergen Reactions   Insect Extract Swelling    BEES, MOSQUITOES   Poison Sumac Extract Rash  DIAGNOSTIC DATA (LABS, IMAGING, TESTING) - I reviewed patient records, labs, notes, testing and imaging myself where available.  Lab Results  Component Value Date   WBC 9.1 08/03/2021   HGB 13.3 08/03/2021   HCT 40.7 08/03/2021   MCV 75 (L) 08/03/2021   PLT 368 08/03/2021      Component Value Date/Time   NA 137 08/03/2021 1515   K 3.7 08/03/2021 1515   CL 102 08/03/2021 1515   CO2 21 08/03/2021 1515   GLUCOSE 76 08/03/2021 1515   GLUCOSE 107 (H) 03/24/2011 2359   BUN 13 08/03/2021 1515   CREATININE 0.77 08/03/2021 1515   CALCIUM 9.8 08/03/2021 1515   PROT 7.8 08/03/2021 1515   ALBUMIN 4.8 08/03/2021 1515   AST 15 08/03/2021 1515   ALT 11 08/03/2021 1515   ALKPHOS 93 08/03/2021 1515   BILITOT 0.3 08/03/2021 1515   GFRNONAA 124 03/30/2016 0923   GFRAA 143 03/30/2016 0923   Lab Results  Component Value Date   CHOL 155 08/03/2021   HDL 37 (L) 08/03/2021   LDLCALC 97 08/03/2021   TRIG 117 08/03/2021   CHOLHDL 4.2 08/03/2021   Lab Results  Component Value Date   HGBA1C 5.2 06/14/2017   No results found for: VITAMINB12 Lab Results  Component Value Date   TSH 2.930 03/30/2016    PHYSICAL EXAM:  Vitals:   05/29/24 1553  BP: 139/87  Pulse: 72   No data found. Body mass index is 38.16 kg/m.   Wt Readings from Last 3 Encounters:  05/29/24 251 lb (113.9 kg)  11/22/23 230 lb (104.3 kg)  05/21/23 208 lb (94.3 kg)     Ht Readings from Last 3 Encounters:  05/29/24 5' 8 (1.727 m)  11/22/23 5' 8 (1.727 m)  05/21/23 5' 8 (1.727 m)      General: The patient is awake, alert and appears not in acute distress and  groomed. Head: Normocephalic, atraumatic.  Nasal airflow  patent.   Overbite  is  seen.  Dental status: biologic  Cardiovascular:  Regular rate and cardiac rhythm by pulse, without distended neck veins. Respiratory: no shortness of breath  Skin:  Without evidence of ankle edema, or rash. Trunk: BMI is 38. 2 Neck is supple. 2, large tonsils neck circumference:16 inches .  Nasal airflow congested, voice is affected   Dental status: biological    Neurologic exam : The patient is awake and alert, oriented to place and time.   Memory subjective described as intact.  Attention span & concentration ability appears normal.  Speech is fluent,  with  dysphonia . Mood and affect are appropriate.   Cranial nerves: no loss of smell or taste reported  Pupils are equal and briskly reactive to light. Funduscopic exam deferred .  Extraocular movements in vertical and horizontal planes were intact and without nystagmus. No Diplopia. Visual fields by finger perimetry are intact. Hearing was intact to soft voice and finger rubbing.    Facial sensation intact to fine touch.  Facial motor strength is symmetric and tongue and uvula move midline.  Neck ROM : rotation, tilt and flexion extension were normal for age and shoulder shrug was symmetrical.    Motor exam:  Symmetric bulk, tone and ROM.   Normal tone without cog wheeling, symmetric grip strength .      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 Last MSLT was SREM negative, HLA test  was positive  DQA1*01:02 Positive  DQB1*06:02 Negative   Her brother has sleep paralysis.   2) insufficient control of  sleepiness on the previous dose of adderall . Not much improvement by doublig he dose thus far.  12-13 hours is her lull time.  Plan : change to adderall XR 30 mg in  AM and then leave prn option for IR adderall 10 mg.  Plan B : Wakix, Xyrem , Xywav or Lumeryz.   Add magnesium Glycnate OTC. At night time.    Follow up with Harlene Bogaert, NP , in 6 months.     I would like to thank Cook, Jayce G, DO and Cook, Jayce G, Do [address] [place],  [place] [address] for allowing me to meet with this pleasant patient.   Sleep Clinic Patients are generally offered input on sleep hygiene, life style changes and how to improve compliance with medical treatment where applicable. Review and reiteration of good sleep hygiene measures is offered to any sleep clinic patient, be it in the first consultation or with any follow up visits.    Any patient with sleepiness should be cautioned not to drive, work at heights, or operate dangerous or heavy equipment when feeling tired or sleepy.      The patient will be seen in follow-up in the sleep clinic at [facility] for discussion of test results, sleep related symptoms and treatment compliance review, further management strategies, etc.   The referring provider will be notified of the test results.   The patient's condition requires frequent monitoring and adjustments in the treatment plan, reflecting the ongoing complexity of care.  This provider is the continuing focal point for all needed services for this condition.  After spending a total time of  35  minutes face to face and time for  history taking, physical and neurologic examination, review of laboratory studies,  personal review of imaging studies, reports and results of other testing and review of referral information / records as far as provided in visit,   Electronically signed by: Dedra Gores, MD 05/29/2024 4:24 PM  [facility] Board certified by The [employer] of Sleep Medicine and Diplomate of the [employer] of Sleep Medicine. Board certified In Neurology through the ABPN, Fellow of the [employer] of Neurology.

## 2024-05-30 LAB — COMPREHENSIVE METABOLIC PANEL WITH GFR
ALT: 14 IU/L (ref 0–32)
AST: 21 IU/L (ref 0–40)
Albumin: 4.2 g/dL (ref 4.0–5.0)
Alkaline Phosphatase: 77 IU/L (ref 41–116)
BUN/Creatinine Ratio: 12 (ref 9–23)
BUN: 9 mg/dL (ref 6–20)
Bilirubin Total: 0.3 mg/dL (ref 0.0–1.2)
CO2: 20 mmol/L (ref 20–29)
Calcium: 9.5 mg/dL (ref 8.7–10.2)
Chloride: 102 mmol/L (ref 96–106)
Creatinine, Ser: 0.78 mg/dL (ref 0.57–1.00)
Globulin, Total: 3 g/dL (ref 1.5–4.5)
Glucose: 78 mg/dL (ref 70–99)
Potassium: 4.1 mmol/L (ref 3.5–5.2)
Sodium: 138 mmol/L (ref 134–144)
Total Protein: 7.2 g/dL (ref 6.0–8.5)
eGFR: 105 mL/min/1.73 (ref >59)

## 2024-05-30 LAB — CBC
Hematocrit: 39.4 % (ref 34.0–46.6)
Hemoglobin: 13 g/dL (ref 11.1–15.9)
MCH: 27.4 pg (ref 26.6–33.0)
MCHC: 33 g/dL (ref 31.5–35.7)
MCV: 83 fL (ref 79–97)
Platelets: 338 x10E3/uL (ref 150–450)
RBC: 4.75 x10E6/uL (ref 3.77–5.28)
RDW: 12.7 % (ref 11.7–15.4)
WBC: 9.8 x10E3/uL (ref 3.4–10.8)

## 2024-05-30 LAB — IRON,TIBC AND FERRITIN PANEL
Ferritin: 26 ng/mL (ref 15–150)
Iron Saturation: 9 % — CL (ref 15–55)
Iron: 41 ug/dL (ref 27–159)
Total Iron Binding Capacity: 453 ug/dL — ABNORMAL HIGH (ref 250–450)
UIBC: 412 ug/dL (ref 131–425)

## 2024-06-01 ENCOUNTER — Ambulatory Visit: Payer: Self-pay | Admitting: Neurology

## 2024-06-02 ENCOUNTER — Other Ambulatory Visit: Payer: Self-pay | Admitting: Family Medicine

## 2024-06-02 MED ORDER — IRON (FERROUS SULFATE) 325 (65 FE) MG PO TABS
325.0000 mg | ORAL_TABLET | ORAL | 1 refills | Status: AC
Start: 2024-06-02 — End: ?

## 2024-06-19 ENCOUNTER — Other Ambulatory Visit: Payer: Self-pay | Admitting: Family Medicine

## 2024-06-19 DIAGNOSIS — Z30011 Encounter for initial prescription of contraceptive pills: Secondary | ICD-10-CM

## 2024-06-27 ENCOUNTER — Encounter: Payer: Self-pay | Admitting: Adult Health

## 2024-06-30 MED ORDER — AMPHETAMINE-DEXTROAMPHET ER 30 MG PO CP24: 30.0000 mg | ORAL_CAPSULE | Freq: Every day | ORAL | 0 refills | Status: AC

## 2024-06-30 NOTE — Telephone Encounter (Signed)
 MD out of office, pt last seen 05/29/24, has upcoming 6/17 with NP, last filled 05/29/24 30 tab. Refill is appropriate.

## 2024-07-09 ENCOUNTER — Ambulatory Visit: Admitting: Family Medicine

## 2024-07-17 ENCOUNTER — Other Ambulatory Visit: Payer: Self-pay | Admitting: Family Medicine

## 2024-07-17 DIAGNOSIS — Z30011 Encounter for initial prescription of contraceptive pills: Secondary | ICD-10-CM

## 2024-08-13 ENCOUNTER — Encounter: Payer: Self-pay | Admitting: Neurology

## 2024-12-24 ENCOUNTER — Ambulatory Visit: Admitting: Adult Health
